# Patient Record
Sex: Female | Born: 1960
Health system: Southern US, Community
[De-identification: ages and names within clinical notes are randomized; demographics above are authoritative.]

## PROBLEM LIST (undated history)

## (undated) DIAGNOSIS — H4010X0 Unspecified open-angle glaucoma, stage unspecified: Secondary | ICD-10-CM

## (undated) DIAGNOSIS — E669 Obesity, unspecified: Secondary | ICD-10-CM

## (undated) DIAGNOSIS — N6310 Unspecified lump in the right breast, unspecified quadrant: Secondary | ICD-10-CM

## (undated) DIAGNOSIS — E785 Hyperlipidemia, unspecified: Secondary | ICD-10-CM

## (undated) DIAGNOSIS — J45909 Unspecified asthma, uncomplicated: Secondary | ICD-10-CM

## (undated) DIAGNOSIS — R7303 Prediabetes: Secondary | ICD-10-CM

## (undated) HISTORY — DX: Prediabetes: R73.03

## (undated) HISTORY — DX: Obesity, unspecified: E66.9

## (undated) HISTORY — DX: Unspecified lump in the right breast, unspecified quadrant: N63.10

## (undated) HISTORY — DX: Unspecified asthma, uncomplicated: J45.909

## (undated) HISTORY — DX: Unspecified open-angle glaucoma, stage unspecified: H40.10X0

## (undated) HISTORY — DX: Hyperlipidemia, unspecified: E78.5

---

## 1997-10-21 ENCOUNTER — Other Ambulatory Visit: Admission: RE | Admit: 1997-10-21 | Discharge: 1997-10-21 | Payer: Self-pay | Admitting: Obstetrics and Gynecology

## 1998-10-26 ENCOUNTER — Other Ambulatory Visit: Admission: RE | Admit: 1998-10-26 | Discharge: 1998-10-26 | Payer: Self-pay | Admitting: Obstetrics and Gynecology

## 1999-02-21 ENCOUNTER — Ambulatory Visit (HOSPITAL_COMMUNITY): Admission: RE | Admit: 1999-02-21 | Discharge: 1999-02-21 | Payer: Self-pay | Admitting: Family Medicine

## 1999-02-21 ENCOUNTER — Encounter: Payer: Self-pay | Admitting: Family Medicine

## 1999-04-14 ENCOUNTER — Encounter: Payer: Self-pay | Admitting: Obstetrics and Gynecology

## 1999-04-14 ENCOUNTER — Ambulatory Visit (HOSPITAL_COMMUNITY): Admission: RE | Admit: 1999-04-14 | Discharge: 1999-04-14 | Payer: Self-pay | Admitting: Obstetrics and Gynecology

## 2000-05-03 ENCOUNTER — Other Ambulatory Visit: Admission: RE | Admit: 2000-05-03 | Discharge: 2000-05-03 | Payer: Self-pay | Admitting: Obstetrics and Gynecology

## 2000-10-22 ENCOUNTER — Other Ambulatory Visit: Admission: RE | Admit: 2000-10-22 | Discharge: 2000-10-22 | Payer: Self-pay | Admitting: Obstetrics and Gynecology

## 2001-06-24 ENCOUNTER — Emergency Department (HOSPITAL_COMMUNITY): Admission: EM | Admit: 2001-06-24 | Discharge: 2001-06-24 | Payer: Self-pay

## 2001-06-24 ENCOUNTER — Encounter: Payer: Self-pay | Admitting: Emergency Medicine

## 2002-06-23 ENCOUNTER — Other Ambulatory Visit: Admission: RE | Admit: 2002-06-23 | Discharge: 2002-06-23 | Payer: Self-pay | Admitting: Obstetrics and Gynecology

## 2002-07-01 ENCOUNTER — Ambulatory Visit (HOSPITAL_COMMUNITY): Admission: RE | Admit: 2002-07-01 | Discharge: 2002-07-01 | Payer: Self-pay | Admitting: Obstetrics and Gynecology

## 2002-07-01 ENCOUNTER — Encounter: Payer: Self-pay | Admitting: Obstetrics and Gynecology

## 2003-11-02 ENCOUNTER — Other Ambulatory Visit: Admission: RE | Admit: 2003-11-02 | Discharge: 2003-11-02 | Payer: Self-pay | Admitting: Obstetrics and Gynecology

## 2003-12-06 ENCOUNTER — Emergency Department (HOSPITAL_COMMUNITY): Admission: EM | Admit: 2003-12-06 | Discharge: 2003-12-06 | Payer: Self-pay | Admitting: Family Medicine

## 2005-04-06 ENCOUNTER — Other Ambulatory Visit: Admission: RE | Admit: 2005-04-06 | Discharge: 2005-04-06 | Payer: Self-pay | Admitting: Obstetrics and Gynecology

## 2005-05-15 ENCOUNTER — Emergency Department (HOSPITAL_COMMUNITY): Admission: EM | Admit: 2005-05-15 | Discharge: 2005-05-15 | Payer: Self-pay | Admitting: Emergency Medicine

## 2005-08-02 ENCOUNTER — Encounter: Admission: RE | Admit: 2005-08-02 | Discharge: 2005-08-02 | Payer: Self-pay | Admitting: Obstetrics and Gynecology

## 2005-08-25 ENCOUNTER — Ambulatory Visit: Payer: Self-pay | Admitting: Internal Medicine

## 2006-12-17 ENCOUNTER — Emergency Department (HOSPITAL_COMMUNITY): Admission: EM | Admit: 2006-12-17 | Discharge: 2006-12-18 | Payer: Self-pay | Admitting: Emergency Medicine

## 2007-02-20 ENCOUNTER — Encounter: Admission: RE | Admit: 2007-02-20 | Discharge: 2007-02-20 | Payer: Self-pay | Admitting: Obstetrics and Gynecology

## 2007-02-27 ENCOUNTER — Encounter: Admission: RE | Admit: 2007-02-27 | Discharge: 2007-02-27 | Payer: Self-pay | Admitting: Obstetrics and Gynecology

## 2007-05-25 ENCOUNTER — Encounter: Payer: Self-pay | Admitting: *Deleted

## 2007-05-25 DIAGNOSIS — R519 Headache, unspecified: Secondary | ICD-10-CM | POA: Insufficient documentation

## 2007-05-25 DIAGNOSIS — I059 Rheumatic mitral valve disease, unspecified: Secondary | ICD-10-CM | POA: Insufficient documentation

## 2007-05-25 DIAGNOSIS — R51 Headache: Secondary | ICD-10-CM

## 2007-07-10 ENCOUNTER — Ambulatory Visit: Payer: Self-pay | Admitting: Internal Medicine

## 2007-07-12 ENCOUNTER — Ambulatory Visit: Payer: Self-pay | Admitting: Internal Medicine

## 2007-07-12 LAB — CONVERTED CEMR LAB
ALT: 12 units/L (ref 0–35)
Alkaline Phosphatase: 69 units/L (ref 39–117)
BUN: 6 mg/dL (ref 6–23)
Basophils Relative: 0.1 % (ref 0.0–1.0)
Bilirubin, Direct: 0.1 mg/dL (ref 0.0–0.3)
CO2: 26 meq/L (ref 19–32)
Calcium: 9 mg/dL (ref 8.4–10.5)
Creatinine, Ser: 0.7 mg/dL (ref 0.4–1.2)
Eosinophils Relative: 3.2 % (ref 0.0–5.0)
GFR calc Af Amer: 115 mL/min
Glucose, Bld: 104 mg/dL — ABNORMAL HIGH (ref 70–99)
HDL: 71.5 mg/dL (ref >39.0)
Hemoglobin: 13.3 g/dL (ref 12.0–15.0)
LDL Cholesterol: 98 mg/dL (ref 0–99)
Lymphocytes Relative: 24.1 % (ref 12.0–46.0)
Monocytes Absolute: 0.2 10*3/uL (ref 0.2–0.7)
Monocytes Relative: 3.3 % (ref 3.0–11.0)
Neutro Abs: 4.2 10*3/uL (ref 1.4–7.7)
Platelets: 356 10*3/uL (ref 150–400)
RDW: 14.8 % — ABNORMAL HIGH (ref 11.5–14.6)
Total Bilirubin: 0.5 mg/dL (ref 0.3–1.2)
Total Protein: 6.9 g/dL (ref 6.0–8.3)
VLDL: 10 mg/dL (ref 0–40)
WBC: 6 10*3/uL (ref 4.5–10.5)

## 2007-07-15 ENCOUNTER — Encounter: Payer: Self-pay | Admitting: Internal Medicine

## 2008-11-26 ENCOUNTER — Encounter: Admission: RE | Admit: 2008-11-26 | Discharge: 2008-11-26 | Payer: Self-pay | Admitting: Obstetrics and Gynecology

## 2008-11-30 ENCOUNTER — Ambulatory Visit: Payer: Self-pay | Admitting: Internal Medicine

## 2008-11-30 DIAGNOSIS — R5383 Other fatigue: Secondary | ICD-10-CM

## 2008-11-30 DIAGNOSIS — R7309 Other abnormal glucose: Secondary | ICD-10-CM

## 2008-11-30 DIAGNOSIS — R5381 Other malaise: Secondary | ICD-10-CM | POA: Insufficient documentation

## 2008-11-30 LAB — CONVERTED CEMR LAB
BUN: 10 mg/dL (ref 6–23)
Basophils Absolute: 0 10*3/uL (ref 0.0–0.1)
Basophils Relative: 0.8 % (ref 0.0–3.0)
Calcium: 9.8 mg/dL (ref 8.4–10.5)
Creatinine, Ser: 0.6 mg/dL (ref 0.4–1.2)
Eosinophils Absolute: 0.2 10*3/uL (ref 0.0–0.7)
GFR calc non Af Amer: 136.51 mL/min (ref >60)
Glucose, Bld: 96 mg/dL (ref 70–99)
Hgb A1c MFr Bld: 6.2 % (ref 4.6–6.5)
Lymphocytes Relative: 28.9 % (ref 12.0–46.0)
MCHC: 34.4 g/dL (ref 30.0–36.0)
MCV: 87.3 fL (ref 78.0–100.0)
Monocytes Absolute: 0.3 10*3/uL (ref 0.1–1.0)
Neutrophils Relative %: 60.5 % (ref 43.0–77.0)
Platelets: 261 10*3/uL (ref 150.0–400.0)
RDW: 12.4 % (ref 11.5–14.6)

## 2008-12-08 ENCOUNTER — Encounter: Payer: Self-pay | Admitting: Internal Medicine

## 2009-10-27 ENCOUNTER — Encounter: Payer: Self-pay | Admitting: Internal Medicine

## 2009-10-27 LAB — CONVERTED CEMR LAB: Pap Smear: NORMAL

## 2010-06-27 ENCOUNTER — Ambulatory Visit: Payer: Self-pay | Admitting: Internal Medicine

## 2010-08-24 ENCOUNTER — Encounter (INDEPENDENT_AMBULATORY_CARE_PROVIDER_SITE_OTHER): Payer: BC Managed Care – PPO | Admitting: Internal Medicine

## 2010-08-24 ENCOUNTER — Encounter: Payer: Self-pay | Admitting: Internal Medicine

## 2010-08-24 DIAGNOSIS — Z0000 Encounter for general adult medical examination without abnormal findings: Secondary | ICD-10-CM

## 2010-08-24 LAB — CONVERTED CEMR LAB
CO2: 27 meq/L (ref 19–32)
Calcium: 9.5 mg/dL (ref 8.4–10.5)
Cholesterol: 202 mg/dL — ABNORMAL HIGH (ref 0–200)
HDL: 62 mg/dL (ref >39)
MCV: 86.1 fL (ref 78.0–100.0)
Platelets: 304 10*3/uL (ref 150–400)
Potassium: 4.2 meq/L (ref 3.5–5.3)
Sodium: 141 meq/L (ref 135–145)
TSH: 0.581 microintl units/mL (ref 0.350–4.500)
Total CHOL/HDL Ratio: 3.3
VLDL: 11 mg/dL (ref 0–40)
WBC: 5.8 10*3/uL (ref 4.0–10.5)

## 2010-08-25 ENCOUNTER — Encounter (INDEPENDENT_AMBULATORY_CARE_PROVIDER_SITE_OTHER): Payer: Self-pay | Admitting: *Deleted

## 2010-08-25 ENCOUNTER — Encounter: Payer: Self-pay | Admitting: Internal Medicine

## 2010-08-31 NOTE — Assessment & Plan Note (Signed)
Summary: cpx/mhf   Vital Signs:  Patient profile:   50 year old female Height:      65 inches Weight:      170.31 pounds BMI:     28.44 O2 Sat:      100 % on Room air Temp:     98.4 degrees F oral Pulse rate:   88 / minute Resp:     18 per minute BP sitting:   114 / 60  (right arm) Cuff size:   regular  Vitals Entered By: Glendell Docker CMA (August 24, 2010 8:43 AM)  O2 Flow:  Room air CC: CPX Is Patient Diabetic? No Pain Assessment Patient in pain? no      Comments fasting for labs, request diabetes check, and colononscopy, no heath  changes since last office visit, EKG done with GYN in 10/2009   Primary Care Provider:  D. Thomos Lemons DO  CC:  CPX.  History of Present Illness: 50 y/o AA female for routine cpx no sign int hx.  reviewed fam hx and soc hx  exercises 3 x per week,  jumps rope, walking,  abd crunches takes multivitamin and b vitamin.  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)   Borerline DM II     Past Surgical History: WISDOM TEETH EXTRACTION, HX OF (ICD-V15.9)       Family History: Mother has hypertension and hyperlipidemia  - PAD (S/P right foot amputation)  Father deceased at age 30 of cancer (pancreatic cancer)  no colon ca no breast ca sister died 68 - Crohns Disease 1 sister, 2 brother - AW  Social History: Never Smoked Alcohol use-no Married  Occupation:  Training and development officer for [employer] no children    Caffeine use/day:   1 per week  Review of Systems  The patient denies fever, weight loss, weight gain, chest pain, dyspnea on exertion, abdominal pain, melena, hematochezia, severe indigestion/heartburn, and depression.    Physical Exam  General:  alert, well-developed, and well-nourished.   Head:  normocephalic and atraumatic.   Eyes:  pupils equal, pupils round, and pupils reactive to light.   Ears:  R ear normal and L ear normal.   Mouth:  good dentition.   Neck:  No deformities, masses, or tenderness noted. Lungs:  normal respiratory effort, normal breath sounds, no crackles, and no wheezes.   Heart:  normal rate, regular rhythm, and no gallop.   Abdomen:  soft, non-tender, normal bowel sounds, no masses, no hepatomegaly, and no splenomegaly.   Pulses:  dorsalis pedis and posterior tibial pulses are full and equal bilaterally Extremities:  No lower extremity edema  Neurologic:  cranial nerves II-XII intact and gait normal.   Psych:  normally interactive, good eye contact, not anxious appearing, and not depressed appearing.     Impression & Recommendations:  Problem # 1:  HEALTH MAINTENANCE EXAM (ICD-V70.0) Reviewed adult health maintenance protocols. Pt counseled on diet and exercise. refer for screening colonoscopy  Mammogram:  normal (08/24/2009) Pap smear: normal (10/27/2009) Bone Density: normal (10/27/2009) Td Booster: given (12/26/2004)   Flu Vax: Historical (06/21/2010)   Chol: 179 (07/12/2007)   HDL: 71.5 (07/12/2007)   LDL: 98 (07/12/2007)   TG: 49 (07/12/2007) TSH: 0.97 (11/30/2008)   HgbA1C: 6.2 (11/30/2008)     Orders: Gastroenterology Referral (GI)  Other Orders: T-Basic Metabolic Panel [phone]) T- Hemoglobin A1C [phone]) T-CBC No Diff (85027-10000) T-Lipid Profile (40347-42595) CRP, high sensitivity-[facility] (63875-64332) T-TSH (95188-41660)  Patient Instructions: 1)  Please schedule a follow-up appointment in 1 year. 2)  Our office will contact you re:  referral  for screening colonoscopy 3)  Please forward copy of your previous EKG    Orders Added: 1)  Gastroenterology Referral [GI] 2)  T-Basic Metabolic Panel [[id number]] 3)  T- Hemoglobin A1C [83036-23375] 4)  T-CBC No Diff [85027-10000] 5)  T-Lipid Profile [80061-22930] 6)  CRP, high sensitivity-[facility] [[id number]] 7)  T-TSH [23557-32202] 8)  Est. Patient 40-64 years [[id number]]   Immunization History:  Influenza Immunization History:    Influenza:  historical (06/21/2010)   Immunization History:  Influenza Immunization History:    Influenza:  Historical (06/21/2010)  Current Allergies (reviewed today): No known allergies    Preventive Care Screening  Bone Density:    Date:  10/27/2009    Results:  normal std dev  Pap Smear:    Date:  10/27/2009    Results:  normal   Mammogram:    Date:  08/24/2009    Results:  normal

## 2010-08-31 NOTE — Letter (Signed)
   [place] at [facility] [address] [place], [place]  [address]  [place] Phone: [phone]      August 25, 2010   Gloria [facility] [address] [place], [place] [address]  RE:  LAB RESULTS  Dear  Ms. Motz,  The following is an interpretation of your most recent lab tests.  Please take note of any instructions provided or changes to medications that have resulted from your lab work.  ELECTROLYTES:  Good - no changes needed  KIDNEY FUNCTION TESTS:  Good - no changes needed  LIPID PANEL:  Stable - no changes needed, Fair - review at your next visit Triglyceride: 56   Cholesterol: 202   LDL: 129   HDL: 62   Chol/HDL%:  3.3 Ratio  THYROID STUDIES:  Thyroid studies normal TSH: 0.581      CBC:  Good - no changes needed  HgA1c - 6.2 (abnormal)     Your blood test shows you are a borderline diabetic.   Please avoid sweets and try to limit your carbohydrate intake to 25 grams per meal.  Regular exercise will also help prevent progression to diabetes.   I suggest we repeat your A1c in 6 months.      Sincerely Yours,    Dr. Thomos Lemons  Appended Document:  mailed

## 2010-08-31 NOTE — Letter (Signed)
Summary: Primary Care Consult Scheduled Letter  [place] at [facility]  [address]   [place], [place] [address]   Phone: [phone]  Fax: [phone]      08/25/2010 MRN: [id number]  CHERISA BRUCKER [address] [place], [place]  [address]  [place]    Dear Ms. Nanda,      We have scheduled an appointment for you.  At the recommendation of Dr._YOO, we have scheduled you for colonoscopy at  [facility], DR Leone Payor on September 21, 2010  at 10AM ARRIVE @ 9AM .  Their address [address]  [place] The office phone number is [phone].  If this appointment day and time is not convenient for you, please feel free to call the office of the doctor you are being referred to at the number listed above and reschedule the appointment.     PREVISIT OF PREP AND INSTRUCTION --- Priscille Loveless [phone] @ 9AM    It is important for you to keep your scheduled appointments. We are here to make sure you are given good patient care.     Thank you, Darral Dash Patient Care Coordinator  at [facility]

## 2010-08-31 NOTE — Letter (Signed)
Summary: Pre Visit Letter Revised  [facility]  [address] [place], [place] [address]   Phone: [phone]  Fax: [phone]        08/25/2010 MRN: [id number]  Gloria Reid [address] [place], [place]  [address]  [place]             Procedure Date:  09-21-10 10am           Dr Leone Payor Direct Colon    Welcome to the Gastroenterology Division at [facility].    You are scheduled to see a nurse for your pre-procedure visit on 09-06-10 at 9am on the 3rd floor at [facility], [address]  We ask that you try to arrive at our office 15 minutes prior to your appointment time to allow for check-in.  Please take a minute to review the attached form.  If you answer "Yes" to one or more of the questions on the first page, we ask that you call the person listed at your earliest opportunity.  If you answer "No" to all of the questions, please complete the rest of the form and bring it to your appointment.    Your nurse visit will consist of discussing your medical and surgical history, your immediate family medical history, and your medications.   If you are unable to list all of your medications on the form, please bring the medication bottles to your appointment and we will list them.  We will need to be aware of both prescribed and over the counter drugs.  We will need to know exact dosage information as well.    Please be prepared to read and sign documents such as consent forms, a financial agreement, and acknowledgement forms.  If necessary, and with your consent, a friend or relative is welcome to sit-in on the nurse visit with you.  Please bring your insurance card so that we may make a copy of it.  If your insurance requires a referral to see a specialist, please bring your referral form from your primary care physician.  No co-pay is required for this nurse visit.     If you cannot keep your appointment, please call [phone] to cancel or reschedule prior  to your appointment date.  This allows [place] the opportunity to schedule an appointment for another patient in need of care.    Thank you for choosing [facility] for your medical needs.  We appreciate the opportunity to care for you.  Please visit [place] at our website  to learn more about our practice.  Sincerely, The Gastroenterology Division

## 2010-09-21 ENCOUNTER — Other Ambulatory Visit: Payer: BC Managed Care – PPO | Admitting: Internal Medicine

## 2011-04-19 ENCOUNTER — Ambulatory Visit
Admission: RE | Admit: 2011-04-19 | Discharge: 2011-04-19 | Disposition: A | Discharge status: Home / Self Care | Payer: BC Managed Care – PPO | Source: Ambulatory Visit | Attending: Obstetrics and Gynecology | Admitting: Obstetrics and Gynecology

## 2011-04-19 ENCOUNTER — Other Ambulatory Visit: Payer: Self-pay | Admitting: Obstetrics and Gynecology

## 2011-04-19 DIAGNOSIS — Z1231 Encounter for screening mammogram for malignant neoplasm of breast: Secondary | ICD-10-CM

## 2011-04-19 DIAGNOSIS — N63 Unspecified lump in unspecified breast: Secondary | ICD-10-CM

## 2011-05-03 ENCOUNTER — Other Ambulatory Visit: Payer: Self-pay | Admitting: Obstetrics and Gynecology

## 2011-05-03 ENCOUNTER — Ambulatory Visit
Admission: RE | Admit: 2011-05-03 | Discharge: 2011-05-03 | Disposition: A | Discharge status: Home / Self Care | Payer: BC Managed Care – PPO | Source: Ambulatory Visit | Attending: Obstetrics and Gynecology | Admitting: Obstetrics and Gynecology

## 2011-05-03 DIAGNOSIS — N63 Unspecified lump in unspecified breast: Secondary | ICD-10-CM

## 2011-05-17 ENCOUNTER — Other Ambulatory Visit (INDEPENDENT_AMBULATORY_CARE_PROVIDER_SITE_OTHER): Payer: Self-pay | Admitting: Surgery

## 2011-05-17 ENCOUNTER — Ambulatory Visit (INDEPENDENT_AMBULATORY_CARE_PROVIDER_SITE_OTHER): Payer: BC Managed Care – PPO | Admitting: Surgery

## 2011-05-17 ENCOUNTER — Encounter (INDEPENDENT_AMBULATORY_CARE_PROVIDER_SITE_OTHER): Payer: Self-pay | Admitting: Surgery

## 2011-05-17 VITALS — BP 122/84 | HR 82 | Temp 97.6°F | Resp 14 | Ht 65.0 in | Wt 173.6 lb

## 2011-05-17 DIAGNOSIS — N6310 Unspecified lump in the right breast, unspecified quadrant: Secondary | ICD-10-CM

## 2011-05-17 DIAGNOSIS — N63 Unspecified lump in unspecified breast: Secondary | ICD-10-CM

## 2011-05-17 NOTE — Progress Notes (Signed)
Chief Complaint   Patient presents with   .  Other     Eval of right breast - palpable mass   HPI  Gloria Reid is a 50 y.o. female. This healthy patient has had a palpable mass in her right upper outer breast near her axilla for at least 6 years. She feels that this has increased in size slightly. She has undergone thorough workup with a mammogram and ultrasound. This was not helpful in localizing any discrete mass. The to his persistence in its enlargement the patient now presents for surgical evaluation.  HPI  Past Medical History   Diagnosis  Date   .  Breast mass, right    History reviewed. No pertinent past surgical history.  History reviewed. No pertinent family history.  Paternal aunt - breast cancer in her 60's  Social History  History   Substance Use Topics   .  Smoking status:  Never Smoker   .  Smokeless tobacco:  Never Used   .  Alcohol Use:  No   No Known Allergies  No current outpatient prescriptions on file.   Review of Systems  Review of Systems  Menarche age 12  Pregnancy age 35 - miscarriage  Menopause - age 47  ROS otherwise negative  Blood pressure 122/84, pulse 82, temperature 97.6 F (36.4 C), temperature source Temporal, resp. rate 14, height 5' 5" (1.651 m), weight 173 lb 9.6 oz (78.744 kg).  Physical Exam  Physical Exam  WDWN in NAD  HEENT: EOMI, sclera anicteric  Neck: No masses, no thyromegaly  Lungs: CTA bilaterally; normal respiratory effort  Breasts: Symmetric; no nipple retraction or discharge; deep in the right axillary tail there is an oval-shaped mass measuring about 3 cm in greatest dimension on the chest wall. No lymphadenopathy  CV: Regular rate and rhythm; no murmurs  Abd: +bowel sounds, soft, non-tender, no masses  Ext: Well-perfused; no edema  Skin: Warm, dry; no sign of jaundice  Data Reviewed  Mammogram/ ultrasound  Assessment  Palpable mass - right breast in axillary tail  Plan  Right excisional breast biopsy. The surgical  procedure was described to the patient. I discussed the incision type and location.  The risks and benefits of the procedure were described to the patient and she wishes to proceed.  We discussed the risks bleeding, infection, damage to other structures, need for further procedures/surgeries. We discussed the risk of seroma. The patient was advised if the area in the breast in cancer, we may need to go back to surgery for additional tissue to obtain negative margins or for a lymph node biopsy. The patient was advised that these are the most common complications, but that others can occur as well. They were advised against taking aspirin or other anti-inflammatory agents/blood thinners the week before surgery. Likelihood if success in removing the breast abnormality is good.  Gloria Reid K.  05/17/2011, 1:28 PM  

## 2011-05-17 NOTE — H&P (Signed)
Chief Complaint   Patient presents with   .  Other     Eval of right breast - palpable mass   HPI  Gloria Reid is a 50 y.o. female. This healthy patient has had a palpable mass in her right upper outer breast near her axilla for at least 6 years. She feels that this has increased in size slightly. She has undergone thorough workup with a mammogram and ultrasound. This was not helpful in localizing any discrete mass. The to his persistence in its enlargement the patient now presents for surgical evaluation.  HPI  Past Medical History   Diagnosis  Date   .  Breast mass, right    History reviewed. No pertinent past surgical history.  History reviewed. No pertinent family history.  Paternal aunt - breast cancer in her 16's  Social History  History   Substance Use Topics   .  Smoking status:  Never Smoker   .  Smokeless tobacco:  Never Used   .  Alcohol Use:  No   No Known Allergies  No current outpatient prescriptions on file.   Review of Systems  Review of Systems  Menarche age 29  Pregnancy age 10 - miscarriage  Menopause - age 30  ROS otherwise negative  Blood pressure 122/84, pulse 82, temperature 97.6 F (36.4 C), temperature source Temporal, resp. rate 14, height 5\' 5"  (1.651 m), weight 173 lb 9.6 oz (78.744 kg).  Physical Exam  Physical Exam  WDWN in NAD  HEENT: EOMI, sclera anicteric  Neck: No masses, no thyromegaly  Lungs: CTA bilaterally; normal respiratory effort  Breasts: Symmetric; no nipple retraction or discharge; deep in the right axillary tail there is an oval-shaped mass measuring about 3 cm in greatest dimension on the chest wall. No lymphadenopathy  CV: Regular rate and rhythm; no murmurs  Abd: +bowel sounds, soft, non-tender, no masses  Ext: Well-perfused; no edema  Skin: Warm, dry; no sign of jaundice  Data Reviewed  Mammogram/ ultrasound  Assessment  Palpable mass - right breast in axillary tail  Plan  Right excisional breast biopsy. The surgical  procedure was described to the patient. I discussed the incision type and location.  The risks and benefits of the procedure were described to the patient and she wishes to proceed.  We discussed the risks bleeding, infection, damage to other structures, need for further procedures/surgeries. We discussed the risk of seroma. The patient was advised if the area in the breast in cancer, we may need to go back to surgery for additional tissue to obtain negative margins or for a lymph node biopsy. The patient was advised that these are the most common complications, but that others can occur as well. They were advised against taking aspirin or other anti-inflammatory agents/blood thinners the week before surgery. Likelihood if success in removing the breast abnormality is good.  Gloria Reid K.  05/17/2011, 1:28 PM

## 2011-06-16 ENCOUNTER — Encounter (HOSPITAL_COMMUNITY): Admission: RE | Payer: Self-pay | Source: Ambulatory Visit

## 2011-06-16 ENCOUNTER — Ambulatory Visit (HOSPITAL_COMMUNITY): Admission: RE | Admit: 2011-06-16 | Payer: BC Managed Care – PPO | Source: Ambulatory Visit | Admitting: Surgery

## 2011-06-16 SURGERY — EXCISION MASS
Anesthesia: General | Site: Breast | Laterality: Right

## 2011-08-25 ENCOUNTER — Other Ambulatory Visit (INDEPENDENT_AMBULATORY_CARE_PROVIDER_SITE_OTHER): Payer: BC Managed Care – PPO

## 2011-08-25 DIAGNOSIS — Z0000 Encounter for general adult medical examination without abnormal findings: Secondary | ICD-10-CM

## 2011-08-25 LAB — CBC WITH DIFFERENTIAL/PLATELET
Basophils Absolute: 0 10*3/uL (ref 0.0–0.1)
Eosinophils Absolute: 0.1 10*3/uL (ref 0.0–0.7)
HCT: 41.1 % (ref 36.0–46.0)
Lymphs Abs: 1.5 10*3/uL (ref 0.7–4.0)
MCV: 86.1 fl (ref 78.0–100.0)
Monocytes Absolute: 0.3 10*3/uL (ref 0.1–1.0)
Platelets: 267 10*3/uL (ref 150.0–400.0)
RDW: 13.8 % (ref 11.5–14.6)

## 2011-08-25 LAB — POCT URINALYSIS DIPSTICK
Ketones, UA: NEGATIVE
Protein, UA: NEGATIVE
Spec Grav, UA: <=1.005
pH, UA: 5.5

## 2011-08-25 LAB — TSH: TSH: 0.84 u[IU]/mL (ref 0.35–5.50)

## 2011-08-25 LAB — LIPID PANEL
Cholesterol: 193 mg/dL (ref 0–200)
HDL: 67.2 mg/dL (ref >39.00)
VLDL: 9.2 mg/dL (ref 0.0–40.0)

## 2011-08-25 LAB — HEPATIC FUNCTION PANEL: Total Bilirubin: 0.2 mg/dL — ABNORMAL LOW (ref 0.3–1.2)

## 2011-08-25 LAB — BASIC METABOLIC PANEL
BUN: 11 mg/dL (ref 6–23)
Chloride: 108 mEq/L (ref 96–112)
Glucose, Bld: 101 mg/dL — ABNORMAL HIGH (ref 70–99)
Potassium: 3.7 mEq/L (ref 3.5–5.1)

## 2011-09-01 ENCOUNTER — Encounter: Payer: Self-pay | Admitting: Internal Medicine

## 2011-09-01 ENCOUNTER — Ambulatory Visit (INDEPENDENT_AMBULATORY_CARE_PROVIDER_SITE_OTHER): Payer: BC Managed Care – PPO | Admitting: Internal Medicine

## 2011-09-01 VITALS — BP 108/78 | HR 84 | Temp 98.6°F | Ht 65.0 in | Wt 180.0 lb

## 2011-09-01 DIAGNOSIS — Z0000 Encounter for general adult medical examination without abnormal findings: Secondary | ICD-10-CM

## 2011-09-01 DIAGNOSIS — N63 Unspecified lump in unspecified breast: Secondary | ICD-10-CM

## 2011-09-01 DIAGNOSIS — N6310 Unspecified lump in the right breast, unspecified quadrant: Secondary | ICD-10-CM

## 2011-09-01 NOTE — Progress Notes (Signed)
  Subjective:    Patient ID: Gloria Reid, female    DOB: Nov 12, 1960, 51 y.o.   MRN: [id number]  HPI  51 year old African American female for routine physical. Since previous visit patient has had mammogram with possible enlarging right breath mass.  Mammogram and breast u/s was not suggestive of malignancy.  However, pt was advised to have surgical follow up.   Pt thought she was having office procedure of needle biopsy but same day surgery was scheduled which she declined.  Otherwise, pt has been following healthier diet.  She avoid sugary beverages.  Her weight is stable.  Screening lab results reviewed in detail.  Mammogram: 04/19/11 No mammographic or sonographic evidence of malignancy is identified  in either breast. I do palpate a firm mass-like area in the  axillary tail the right breast, which the patient has palpated for  several years. She believes that has increased in size, and its  presence is somewhat bothersome to her.   Review of Systems   Constitutional: Negative for activity change, appetite change and unexpected weight change.  Eyes: Negative for visual disturbance.  Respiratory: Negative for cough, chest tightness and shortness of breath.   Cardiovascular: Negative for chest pain.  Genitourinary: Negative for difficulty urinating.  Neurological: Negative for headaches.  Gastrointestinal: Negative for abdominal pain, heartburn melena or hematochezia Psych: Negative for depression or anxiety  Past Medical History  Diagnosis Date  . Breast mass, right     History   Social History  . Marital Status: Married    Spouse Name: N/A    Number of Children: N/A  . Years of Education: N/A   Occupational History  . Not on file.   Social History Main Topics  . Smoking status: Never Smoker   . Smokeless tobacco: Never Used  . Alcohol Use: No  . Drug Use: No  . Sexually Active: Not on file   Other Topics Concern  . Not on file   Social History Narrative   . No narrative on file    No past surgical history on file.  No family history on file.  No Known Allergies  No current outpatient prescriptions on file prior to visit.    BP 108/78  Pulse 84  Temp(Src) 98.6 F (37 C) (Oral)  Ht 5\' 5"  (1.651 m)  Wt 180 lb (81.647 kg)  BMI 29.95 kg/m2  SpO2 98%     Objective:   Physical Exam  Constitutional: She is oriented to person, place, and time. She appears well-developed and well-nourished.  HENT:  Head: Normocephalic and atraumatic.  Right Ear: External ear normal.  Left Ear: External ear normal.  Mouth/Throat: Oropharynx is clear and moist.  Eyes: Conjunctivae are normal. Pupils are equal, round, and reactive to light.  Neck: Neck supple.  Cardiovascular: Normal rate, regular rhythm and normal heart sounds.   Pulmonary/Chest: Effort normal and breath sounds normal. She has no wheezes. She has no rales.       2 cm right breat mass - 9 to 10 o'clock position.  Firm to deep palpation.  Non tender  Abdominal: Soft. Bowel sounds are normal.  Musculoskeletal: She exhibits no edema.  Lymphadenopathy:    She has no cervical adenopathy.  Neurological: She is alert and oriented to person, place, and time.  Skin: Skin is warm and dry.      Assessment & Plan:

## 2011-09-01 NOTE — Patient Instructions (Addendum)
Please follow with surgeon re:  Right breast mass Please complete the following lab tests before your next follow up appointment: CPX labs - 790.29

## 2011-09-01 NOTE — Assessment & Plan Note (Signed)
Reviewed adult health maintenance protocols.  I encouraged further weight loss.  She has shown signs of pre diabetes in the past.   She has done well with dietary changes.  Arrange iFOB.

## 2011-09-01 NOTE — Assessment & Plan Note (Signed)
Consult Dr. Corliss Skains re:  Right breast mass.  Pt would prefer needle biopsy but understands she may require lumpectomy for definitive diagnosis.

## 2011-09-04 ENCOUNTER — Encounter (INDEPENDENT_AMBULATORY_CARE_PROVIDER_SITE_OTHER): Payer: Self-pay | Admitting: Surgery

## 2011-09-14 ENCOUNTER — Encounter: Payer: Self-pay | Admitting: Speech Pathology

## 2011-09-18 ENCOUNTER — Encounter (INDEPENDENT_AMBULATORY_CARE_PROVIDER_SITE_OTHER): Payer: Self-pay | Admitting: Surgery

## 2011-09-19 ENCOUNTER — Encounter (INDEPENDENT_AMBULATORY_CARE_PROVIDER_SITE_OTHER): Payer: BC Managed Care – PPO | Admitting: Surgery

## 2011-10-03 ENCOUNTER — Encounter (INDEPENDENT_AMBULATORY_CARE_PROVIDER_SITE_OTHER): Payer: BC Managed Care – PPO | Admitting: Surgery

## 2011-10-05 ENCOUNTER — Encounter (INDEPENDENT_AMBULATORY_CARE_PROVIDER_SITE_OTHER): Payer: BC Managed Care – PPO | Admitting: Surgery

## 2011-10-20 ENCOUNTER — Ambulatory Visit (INDEPENDENT_AMBULATORY_CARE_PROVIDER_SITE_OTHER): Payer: BC Managed Care – PPO | Admitting: Surgery

## 2011-10-20 ENCOUNTER — Encounter (INDEPENDENT_AMBULATORY_CARE_PROVIDER_SITE_OTHER): Payer: Self-pay | Admitting: Surgery

## 2011-10-20 VITALS — BP 122/76 | HR 72 | Temp 97.8°F | Resp 16 | Ht 65.0 in | Wt 174.0 lb

## 2011-10-20 DIAGNOSIS — N63 Unspecified lump in unspecified breast: Secondary | ICD-10-CM

## 2011-10-20 DIAGNOSIS — N6310 Unspecified lump in the right breast, unspecified quadrant: Secondary | ICD-10-CM

## 2011-10-20 NOTE — Progress Notes (Signed)
Today's note 10/20/11  The patient returns again to discuss the palpable right breast mass.  She feels that this might be slightly larger.  She intentionally chose not to schedule surgery as recommended last year because she doesn't want to have any anesthesia.    She was evaluated in 2007 by Dr. Francina Ames for this same complaint, and mammogram/ U/S were not helpful at that time either.  Last years' studies were also unremarkable, although the mass remains palpable.  No discrete masses are noted on imaging that would allow for an image-directed core needle biopsy.  Therefore, we recommended surgical excision.    PMH/ PSH unchanged.  The patient states that she has never had surgery or any type of anesthesia.  Filed Vitals:   10/20/11 0938  BP: 122/76  Pulse: 72  Temp: 97.8 F (36.6 C)  Resp: 16    Breast examination:    Symmetric, no nipple retraction or nipple discharge No axillary lymphadenopathy Persistent area of firmness deep in the right breast near the axillary tail at 10:00.    Imp:  Persistent, possibly mildly enlarged right breast mass - has been present for at least 6 years.  Recommendations:  Again, I recommended excision right breast biopsy.  I spent some time with the patient discussing her aversion to anesthesia.  She states that she doesn't want to have any drugs given in an IV.  She denies any previous bad experiences with anesthesia.  The mass is too deep to remove under straight local.  She refuses even to consider deep MAC with IV sedation and local.  She actually seems angry when I tried to convince her that anesthesia is relatively safe, and that the only way to know for sure what this mass represents is to remove for pathology.  I explained the prevalence of breast cancer in the female population and that breast cancer is usually easier to treat if caught early.   She refuses to even consider surgical biopsy, which begs the question of why she returned today.  I gave  her the same recommendation that I gave last year.  Her anger and reluctance to even consider surgical biopsy seem rather irrational.  I recommended that she find another surgeon for a second opinion and treatment.  They will likely give her the same recommendation.    Wilmon Arms. Corliss Skains, MD, [facility]  10/20/2011 12:17 PM     Previous note from 05/17/11.    HPI Odette Watanabe is a 51 y.o. female. This healthy patient has had a palpable mass in her right upper outer breast near her axilla for at least 6 years. She feels that this has increased in size slightly. She has undergone thorough workup with a mammogram and ultrasound. This was not helpful in localizing any discrete mass. The to his persistence in its enlargement the patient now presents for surgical evaluation. HPI  Past Medical History  Diagnosis Date  . Breast mass, right     History reviewed. No pertinent past surgical history.  History reviewed. No pertinent family history. Paternal aunt - breast cancer in her 99's Social History History  Substance Use Topics  . Smoking status: Never Smoker   . Smokeless tobacco: Never Used  . Alcohol Use: No    No Known Allergies  No current outpatient prescriptions on file.    Review of Systems Review of Systems Menarche age 78 Pregnancy age 70 - miscarriage Menopause - age 57  ROS otherwise negative Blood pressure  122/84, pulse 82, temperature 97.6 F (36.4 C), temperature source Temporal, resp. rate 14, height 5\' 5"  (1.651 m), weight 173 lb 9.6 oz (78.744 kg).  Physical Exam Physical Exam WDWN in NAD HEENT:  EOMI, sclera anicteric Neck:  No masses, no thyromegaly Lungs:  CTA bilaterally; normal respiratory effort Breasts:  Symmetric; no nipple retraction or discharge; deep in the right axillary tail there is an oval-shaped mass measuring about 3 cm in greatest dimension on the chest wall.  No lymphadenopathy CV:  Regular rate and rhythm; no  murmurs Abd:  +bowel sounds, soft, non-tender, no masses Ext:  Well-perfused; no edema Skin:  Warm, dry; no sign of jaundice  Data Reviewed Mammogram/ ultrasound  Assessment    Palpable mass - right breast in axillary tail    Plan    Right excisional breast biopsy.  The surgical procedure was described to the patient.  I discussed the incision type and location.      The risks and benefits of the procedure were described to the patient and she wishes to proceed.    We discussed the risks bleeding, infection, damage to other structures, need for further procedures/surgeries.  We discussed the risk of seroma.  The patient was advised if the area in the breast in cancer, we may need to go back to surgery for additional tissue to obtain negative margins or for a lymph node biopsy. The patient was advised that these are the most common complications, but that others can occur as well.  They were advised against taking aspirin or other anti-inflammatory agents/blood thinners the week before surgery.  Likelihood if success in removing the breast abnormality is good.        Kenetra Hildenbrand K. 05/17/2011, 1:28 PM

## 2011-10-27 ENCOUNTER — Encounter: Payer: Self-pay | Admitting: *Deleted

## 2011-10-27 ENCOUNTER — Encounter: Payer: BC Managed Care – PPO | Attending: Obstetrics and Gynecology | Admitting: *Deleted

## 2011-10-27 VITALS — Ht 65.0 in | Wt 174.9 lb

## 2011-10-27 DIAGNOSIS — R7309 Other abnormal glucose: Secondary | ICD-10-CM

## 2011-10-27 DIAGNOSIS — Z713 Dietary counseling and surveillance: Secondary | ICD-10-CM | POA: Insufficient documentation

## 2011-10-27 NOTE — Patient Instructions (Addendum)
Plan: Aim for 2-3 Carb Choices per meal (30-45 grams), 0-2 Carbs per snack if hungry Read Food Labels for Total Carbohydrate of foods eaten Consider checking BG about once a month at MD office to monitor BG before next MD appointment scheduled for 1 year from now.

## 2011-10-27 NOTE — Progress Notes (Signed)
Medical Nutrition Therapy:  Appt start time: 0800 end time:  0900.  Assessment:  Primary concerns today: patient here for nutrition counseling for abnormal glucose. 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 She has started including 3 meals a day, not skipping breakfast anymore. She has also cut out sweetened beverages recently.  MEDICATIONS: see list   DIETARY INTAKE:  Usual eating pattern includes 3 meals and 3 snacks per day.  Everyday foods include fair variety of all food groups.  Avoided foods include fried or high sugar foods now.    24-hr recall:  B ( AM): now: 8 oz milk first  Snk ( AM): fresh fruit x 2 + L ( PM): sandwich OR salad OR full meal, water  Snk ( PM): fresh fruit x 2 + D ( PM): at work, bring from home: meat, starch, vegetable,  Snk ( PM): fresh fruit x 1, then peanuts Beverages: water, flavored water with lemon, cuke, milk, occasionally tea  Usual physical activity: every AM stretches for 3-5 minutes, housework,   Estimated energy needs: 1500 calories 170 g carbohydrates 112 g protein 42 g fat  Progress Towards Goal(s):  In progress.   Nutritional Diagnosis:  NB-1.1 Food and nutrition-related knowledge deficit As related to elevated glucose levels.  As evidenced by FBG of 115 mg/dl.    Intervention:  Nutrition counseling provided including macro-nutrients and how to distribute especially carbohydrate intake for more consistent blood glucose levels. Also discussed basic physiology of diabetes for future reference, value of self-monitoring BG occasionally to assess progress, and reading food labels. Plan: Aim for 2-3 Carb Choices per meal (30-45 grams), 0-2 Carbs per snack if hungry Read Food Labels for Total Carbohydrate of foods eaten Consider checking BG about once a month at MD office to monitor BG before next MD appointment scheduled for 1 year from now.   Handouts given during visit include:  Living Well with  Diabetes  Carb Counting and Food Label handouts  Meal Plan Card  Monitoring/Evaluation:  Dietary intake, exercise, reading food labels, and body weight in 4 week(s).

## 2011-12-01 ENCOUNTER — Ambulatory Visit: Payer: BC Managed Care – PPO | Admitting: *Deleted

## 2011-12-19 ENCOUNTER — Ambulatory Visit: Payer: BC Managed Care – PPO | Admitting: *Deleted

## 2012-02-28 ENCOUNTER — Ambulatory Visit (INDEPENDENT_AMBULATORY_CARE_PROVIDER_SITE_OTHER): Payer: BC Managed Care – PPO | Admitting: Surgery

## 2012-02-28 ENCOUNTER — Encounter (INDEPENDENT_AMBULATORY_CARE_PROVIDER_SITE_OTHER): Payer: Self-pay | Admitting: Surgery

## 2012-02-28 VITALS — BP 132/88 | HR 96 | Temp 97.8°F | Ht 65.0 in | Wt 174.4 lb

## 2012-02-28 DIAGNOSIS — N6310 Unspecified lump in the right breast, unspecified quadrant: Secondary | ICD-10-CM

## 2012-02-28 DIAGNOSIS — N63 Unspecified lump in unspecified breast: Secondary | ICD-10-CM

## 2012-02-28 NOTE — Progress Notes (Signed)
Subjective:     Patient ID: Gloria Reid, female   DOB: 09-19-1960, 51 y.o.   MRN: [id number]  HPI  She is here for another opinion regarding her right breast mass which has been present for more than 5 years. She now is in agreement to have it removed. Again, her x-ray data is negative Review of Systems     Objective:   Physical Exam There is a 2-3 cm oval mass deep in the right breast and axilla    Assessment:    Right breast mass    Plan:     She is now in agreement to proceed with excisional biopsy which will be scheduled

## 2012-03-28 ENCOUNTER — Encounter (HOSPITAL_COMMUNITY): Payer: Self-pay | Admitting: Pharmacy Technician

## 2012-04-02 ENCOUNTER — Encounter (HOSPITAL_COMMUNITY)
Admission: RE | Admit: 2012-04-02 | Discharge: 2012-04-02 | Disposition: A | Discharge status: Home / Self Care | Payer: BC Managed Care – PPO | Source: Ambulatory Visit | Attending: Surgery | Admitting: Surgery

## 2012-04-02 ENCOUNTER — Encounter (HOSPITAL_COMMUNITY): Payer: Self-pay

## 2012-04-02 LAB — CBC
MCH: 28.8 pg (ref 26.0–34.0)
MCV: 84.3 fL (ref 78.0–100.0)
Platelets: 258 10*3/uL (ref 150–400)
RDW: 13.3 % (ref 11.5–15.5)
WBC: 5.6 10*3/uL (ref 4.0–10.5)

## 2012-04-02 LAB — SURGICAL PCR SCREEN: MRSA, PCR: NEGATIVE

## 2012-04-02 NOTE — Patient Instructions (Addendum)
20 Gloria Reid  04/02/2012   Your procedure is scheduled on:  04/03/12  Wednesday  Surgery 1130-1230  Report to [facility] at  0900     AM.  Call this number if you have problems the morning of surgery: [phone]     Or PST   [id number]  [facility]   Remember:   Do not eat food  Or drink any fluids :After Midnight tonight      Take these medicines the morning of surgery with A SIP OF WATER:  NONE   Do not wear jewelry, make-up or nail polish.  Do not wear lotions, powders, or perfumes. You may wear deodorant.  Do not shave 48 hours prior to surgery.  Do not bring valuables to the hospital.  Contacts, dentures or bridgework may not be worn into surgery.  Leave suitcase in the car. After surgery it may be brought to your room.  For patients admitted to the hospital, checkout time is 11:00 AM the day of discharge.   Patients discharged the day of surgery will not be allowed to drive home.  Name and phone number of your driver:   Husband [name]                                                                   Special Instructions: CHG Shower Use Special Wash:   SEE ADDITIONAL SHEET FOR SHOWER INSTRUCTIONS---  REGULAR SOAP FACE AND PRIVATES              LADIES- NO SHAVING 48 HOURS BEFORE USING BETASEPT SOAP.                   Please read over the following fact sheets that you were given: MRSA Information

## 2012-04-03 ENCOUNTER — Encounter (HOSPITAL_COMMUNITY): Payer: Self-pay | Admitting: Anesthesiology

## 2012-04-03 ENCOUNTER — Ambulatory Visit (HOSPITAL_COMMUNITY): Payer: BC Managed Care – PPO | Admitting: Anesthesiology

## 2012-04-03 ENCOUNTER — Encounter (HOSPITAL_COMMUNITY)
Admission: RE | Disposition: A | Discharge status: Home / Self Care | Payer: Self-pay | Source: Ambulatory Visit | Attending: Surgery

## 2012-04-03 ENCOUNTER — Encounter (HOSPITAL_COMMUNITY): Payer: Self-pay | Admitting: *Deleted

## 2012-04-03 ENCOUNTER — Ambulatory Visit (HOSPITAL_COMMUNITY)
Admission: RE | Admit: 2012-04-03 | Discharge: 2012-04-03 | Disposition: A | Discharge status: Home / Self Care | Payer: BC Managed Care – PPO | Source: Ambulatory Visit | Attending: Surgery | Admitting: Surgery

## 2012-04-03 DIAGNOSIS — D249 Benign neoplasm of unspecified breast: Secondary | ICD-10-CM

## 2012-04-03 DIAGNOSIS — Z01812 Encounter for preprocedural laboratory examination: Secondary | ICD-10-CM | POA: Insufficient documentation

## 2012-04-03 DIAGNOSIS — N63 Unspecified lump in unspecified breast: Secondary | ICD-10-CM | POA: Insufficient documentation

## 2012-04-03 HISTORY — PX: MASS EXCISION: SHX2000

## 2012-04-03 SURGERY — EXCISION MASS
Anesthesia: General | Site: Breast | Laterality: Right | Wound class: Clean

## 2012-04-03 MED ORDER — DEXAMETHASONE SODIUM PHOSPHATE 4 MG/ML IJ SOLN
INTRAMUSCULAR | Status: DC | PRN
  Administered 2012-04-03: 10 mg via INTRAVENOUS

## 2012-04-03 MED ORDER — MIDAZOLAM HCL 5 MG/5ML IJ SOLN
INTRAMUSCULAR | Status: DC | PRN
  Administered 2012-04-03: 2 mg via INTRAVENOUS

## 2012-04-03 MED ORDER — FENTANYL CITRATE 0.05 MG/ML IJ SOLN
INTRAMUSCULAR | Status: DC | PRN
  Administered 2012-04-03: 50 ug via INTRAVENOUS

## 2012-04-03 MED ORDER — LIDOCAINE HCL (CARDIAC) 20 MG/ML IV SOLN
INTRAVENOUS | Status: DC | PRN
  Administered 2012-04-03: 100 mg via INTRAVENOUS

## 2012-04-03 MED ORDER — HYDROCODONE-ACETAMINOPHEN 5-325 MG PO TABS: 1.0000 | ORAL_TABLET | ORAL | Status: DC | PRN

## 2012-04-03 MED ORDER — CEFAZOLIN SODIUM-DEXTROSE 2-3 GM-% IV SOLR
INTRAVENOUS | Status: AC
  Filled 2012-04-03: qty 50

## 2012-04-03 MED ORDER — ONDANSETRON HCL 4 MG/2ML IJ SOLN
INTRAMUSCULAR | Status: DC | PRN
  Administered 2012-04-03: 4 mg via INTRAVENOUS

## 2012-04-03 MED ORDER — BUPIVACAINE LIPOSOME 1.3 % IJ SUSP
20.0000 mL | INTRAMUSCULAR | Status: DC
  Filled 2012-04-03: qty 20

## 2012-04-03 MED ORDER — ACETAMINOPHEN 10 MG/ML IV SOLN
INTRAVENOUS | Status: DC | PRN
  Administered 2012-04-03: 1000 mg via INTRAVENOUS

## 2012-04-03 MED ORDER — ACETAMINOPHEN 10 MG/ML IV SOLN
INTRAVENOUS | Status: AC
  Filled 2012-04-03: qty 100

## 2012-04-03 MED ORDER — BUPIVACAINE-EPINEPHRINE 0.25% -1:200000 IJ SOLN
INTRAMUSCULAR | Status: DC | PRN
Start: 2012-04-03 — End: 2012-04-03
  Administered 2012-04-03: 20 mL

## 2012-04-03 MED ORDER — PROPOFOL 10 MG/ML IV BOLUS
INTRAVENOUS | Status: DC | PRN
  Administered 2012-04-03: 200 mg via INTRAVENOUS

## 2012-04-03 MED ORDER — KETOROLAC TROMETHAMINE 30 MG/ML IJ SOLN
INTRAMUSCULAR | Status: DC | PRN
  Administered 2012-04-03: 30 mg via INTRAVENOUS

## 2012-04-03 MED ORDER — BUPIVACAINE-EPINEPHRINE PF 0.25-1:200000 % IJ SOLN
INTRAMUSCULAR | Status: AC
  Filled 2012-04-03: qty 30

## 2012-04-03 MED ORDER — FENTANYL CITRATE 0.05 MG/ML IJ SOLN: 25.0000 ug | INTRAMUSCULAR | Status: DC | PRN

## 2012-04-03 MED ORDER — LACTATED RINGERS IV SOLN
INTRAVENOUS | Status: DC
  Administered 2012-04-03: 12:00:00 via INTRAVENOUS
  Administered 2012-04-03: 1000 mL via INTRAVENOUS

## 2012-04-03 MED ORDER — CEFAZOLIN SODIUM-DEXTROSE 2-3 GM-% IV SOLR
2.0000 g | INTRAVENOUS | Status: AC
  Administered 2012-04-03: 2 g via INTRAVENOUS

## 2012-04-03 MED ORDER — PROMETHAZINE HCL 25 MG/ML IJ SOLN: 6.2500 mg | INTRAMUSCULAR | Status: DC | PRN

## 2012-04-03 SURGICAL SUPPLY — 40 items
BENZOIN TINCTURE PRP APPL 2/3 (GAUZE/BANDAGES/DRESSINGS) ×2 IMPLANT
BLADE HEX COATED 2.75 (ELECTRODE) ×2 IMPLANT
BLADE SURG 15 STRL LF DISP TIS (BLADE) ×1 IMPLANT
BLADE SURG 15 STRL SS (BLADE) ×1
BLADE SURG SZ10 CARB STEEL (BLADE) ×2 IMPLANT
CANISTER SUCTION 2500CC (MISCELLANEOUS) ×2 IMPLANT
CHLORAPREP W/TINT 26ML (MISCELLANEOUS) ×2 IMPLANT
CLOTH BEACON ORANGE TIMEOUT ST (SAFETY) ×2 IMPLANT
CLSR STERI-STRIP ANTIMIC 1/2X4 (GAUZE/BANDAGES/DRESSINGS) ×2 IMPLANT
DECANTER SPIKE VIAL GLASS SM (MISCELLANEOUS) IMPLANT
DERMABOND ADVANCED (GAUZE/BANDAGES/DRESSINGS)
DERMABOND ADVANCED .7 DNX12 (GAUZE/BANDAGES/DRESSINGS) IMPLANT
DRAIN PENROSE 18X1/2 LTX STRL (DRAIN) IMPLANT
DRAPE LAPAROTOMY TRNSV 102X78 (DRAPE) ×2 IMPLANT
ELECT REM PT RETURN 9FT ADLT (ELECTROSURGICAL) ×2
ELECTRODE REM PT RTRN 9FT ADLT (ELECTROSURGICAL) ×1 IMPLANT
GAUZE SPONGE 4X4 16PLY XRAY LF (GAUZE/BANDAGES/DRESSINGS) IMPLANT
GLOVE SURG SIGNA 7.5 PF LTX (GLOVE) ×8 IMPLANT
GOWN STRL NON-REIN LRG LVL3 (GOWN DISPOSABLE) ×2 IMPLANT
GOWN STRL REIN XL XLG (GOWN DISPOSABLE) ×2 IMPLANT
KIT BASIN OR (CUSTOM PROCEDURE TRAY) ×2 IMPLANT
NEEDLE HYPO 22GX1.5 SAFETY (NEEDLE) IMPLANT
NEEDLE HYPO 25X1 1.5 SAFETY (NEEDLE) ×2 IMPLANT
NS IRRIG 1000ML POUR BTL (IV SOLUTION) ×2 IMPLANT
PACK BASIC VI WITH GOWN DISP (CUSTOM PROCEDURE TRAY) ×2 IMPLANT
PENCIL BUTTON HOLSTER BLD 10FT (ELECTRODE) ×2 IMPLANT
SPONGE GAUZE 4X4 12PLY (GAUZE/BANDAGES/DRESSINGS) IMPLANT
SPONGE LAP 4X18 X RAY DECT (DISPOSABLE) ×2 IMPLANT
STRIP CLOSURE SKIN 1/2X4 (GAUZE/BANDAGES/DRESSINGS) ×2 IMPLANT
SUT MNCRL AB 4-0 PS2 18 (SUTURE) ×2 IMPLANT
SUT VIC AB 2-0 CT1 27 (SUTURE)
SUT VIC AB 2-0 CT1 TAPERPNT 27 (SUTURE) IMPLANT
SUT VIC AB 3-0 54XBRD REEL (SUTURE) IMPLANT
SUT VIC AB 3-0 BRD 54 (SUTURE)
SUT VIC AB 3-0 SH 27 (SUTURE) ×1
SUT VIC AB 3-0 SH 27XBRD (SUTURE) ×1 IMPLANT
SYR BULB IRRIGATION 50ML (SYRINGE) IMPLANT
SYR CONTROL 10ML LL (SYRINGE) ×2 IMPLANT
TOWEL OR 17X26 10 PK STRL BLUE (TOWEL DISPOSABLE) ×2 IMPLANT
YANKAUER SUCT BULB TIP 10FT TU (MISCELLANEOUS) ×2 IMPLANT

## 2012-04-03 NOTE — Preoperative (Signed)
Beta Blockers   Reason not to administer Beta Blockers:Not Applicable 

## 2012-04-03 NOTE — Op Note (Signed)
EXCISION MASS  Procedure Note  Gloria Reid 04/03/2012   Pre-op Diagnosis: Right breast mass     Post-op Diagnosis: same  Procedure(s): EXCISION MASS RIGHT BREAST  Surgeon(s): Shelly Rubenstein, MD  Anesthesia: General  Staff:  Dominga Ferry, RN - Circulator Matilde Sprang Player, CST - Scrub Person  Estimated Blood Loss: Minimal               Specimens: suspect lipoma  Procedure: The patient was brought to the operating room and identified as the correct patient. She was placed supine on the operating room table and general anesthesia was induced. Her right breast was then prepped and draped in the usual sterile fashion. The mass was located in the upper outer quadrant of the right breast. I anesthetized the skin with Marcaine and made an incision with a scalpel. I took this down to the breast tissue. The mass itself appeared consistent with a lipoma it was excised with the electrocautery. Once the mass was removed it was sent to pathology for evaluation. I thoroughly palpated the breast all around the biopsy site and could feel no other masses or abnormalities. The incision was anesthetized further. Hemostasis was achieved with cautery. Because of subspace tissue with interrupted 3-0 Vicryl sutures and closed the skin with a running 4-0 Monocryl. Steri-Strips, gauze, and Tegaderm were then applied. The patient tolerated procedure well. All the counterproductive at the end of the procedure. The patient was then extubated in the operating room and taken in stable condition to the recovery room.          Samyak Sackmann A   Date: 04/03/2012  Time: 11:59 AM

## 2012-04-03 NOTE — Anesthesia Postprocedure Evaluation (Signed)
  Anesthesia Post-op Note  Patient: Gloria Reid  Procedure(s) Performed: Procedure(s) (LRB): EXCISION MASS (Right)  Patient Location: PACU  Anesthesia Type: General  Level of Consciousness: awake and alert   Airway and Oxygen Therapy: Patient Spontanous Breathing  Post-op Pain: mild  Post-op Assessment: Post-op Vital signs reviewed, Patient's Cardiovascular Status Stable, Respiratory Function Stable, Patent Airway and No signs of Nausea or vomiting  Post-op Vital Signs: stable  Complications: No apparent anesthesia complications

## 2012-04-03 NOTE — H&P (Signed)
Gloria Reid is an 51 y.o. female.   Chief Complaint: right breast mass HPI: known right breast mass for 5 years.  Patient now ready to have surgery.  Has been fearful of surgery in past, but now wants mass removed  Past Medical History  Diagnosis Date  . Breast mass, right     Past Surgical History  Procedure Date  . No past surgeries     No family history on file. Social History:  reports that she has never smoked. She has never used smokeless tobacco. She reports that she does not drink alcohol or use illicit drugs.  Allergies: No Known Allergies  No prescriptions prior to admission    Results for orders placed during the hospital encounter of 04/02/12 (from the past 48 hour(s))  SURGICAL PCR SCREEN     Status: Normal   Collection Time   04/02/12  9:00 AM      Component Value Range Comment   MRSA, PCR NEGATIVE  NEGATIVE    Staphylococcus aureus NEGATIVE  NEGATIVE   CBC     Status: Normal   Collection Time   04/02/12  9:05 AM      Component Value Range Comment   WBC 5.6  4.0 - 10.5 K/uL    RBC 4.90  3.87 - 5.11 MIL/uL    Hemoglobin 14.1  12.0 - 15.0 g/dL    HCT 82.9  56.2 - 13.0 %    MCV 84.3  78.0 - 100.0 fL    MCH 28.8  26.0 - 34.0 pg    MCHC 34.1  30.0 - 36.0 g/dL    RDW 86.5  78.4 - 69.6 %    Platelets 258  150 - 400 K/uL    No results found.  Review of Systems  All other systems reviewed and are negative.    There were no vitals taken for this visit. Physical Exam  Constitutional: She appears well-developed and well-nourished.  HENT:  Head: Normocephalic and atraumatic.  Eyes: Pupils are equal, round, and reactive to light.  Neck: Normal range of motion. Neck supple.  Cardiovascular: Normal rate, regular rhythm, normal heart sounds and intact distal pulses.   No murmur heard. Respiratory: Effort normal and breath sounds normal. No respiratory distress. She has no wheezes.   Breast:  Mass in right breast near the axilla, 2-3 cm in  size  Assessment/Plan Right breast mass  Excision of right breast mass is recommended.  Risks discussed in detail, including, but not limited to bleeding, infection, recurrence, need for further surgery should malignancy be present, etc.  Likelihood of success is good.  Jakavion Bilodeau A 04/03/2012, 8:07 AM

## 2012-04-03 NOTE — Anesthesia Preprocedure Evaluation (Signed)

## 2012-04-03 NOTE — Transfer of Care (Signed)
Immediate Anesthesia Transfer of Care Note  Patient: Gloria Reid  Procedure(s) Performed: Procedure(s) (LRB) with comments: EXCISION MASS (Right) - Excision Right Breast Mass  Patient Location: PACU  Anesthesia Type: General  Level of Consciousness: awake, alert  and oriented  Airway & Oxygen Therapy: Patient Spontanous Breathing and Patient connected to face mask oxygen  Post-op Assessment: Report given to PACU RN and Post -op Vital signs reviewed and stable  Post vital signs: Reviewed and stable  Complications: No apparent anesthesia complications

## 2012-04-04 ENCOUNTER — Encounter (HOSPITAL_COMMUNITY): Payer: Self-pay | Admitting: Surgery

## 2012-04-18 ENCOUNTER — Encounter (INDEPENDENT_AMBULATORY_CARE_PROVIDER_SITE_OTHER): Payer: Self-pay | Admitting: General Surgery

## 2012-04-18 ENCOUNTER — Ambulatory Visit (INDEPENDENT_AMBULATORY_CARE_PROVIDER_SITE_OTHER): Payer: BC Managed Care – PPO | Admitting: Surgery

## 2012-04-18 ENCOUNTER — Encounter (INDEPENDENT_AMBULATORY_CARE_PROVIDER_SITE_OTHER): Payer: Self-pay | Admitting: Surgery

## 2012-04-18 VITALS — BP 122/74 | HR 70 | Temp 97.8°F | Resp 16 | Ht 65.0 in | Wt 169.0 lb

## 2012-04-18 DIAGNOSIS — Z09 Encounter for follow-up examination after completed treatment for conditions other than malignant neoplasm: Secondary | ICD-10-CM

## 2012-04-18 NOTE — Progress Notes (Signed)
Subjective:     Patient ID: Gloria Reid, female   DOB: [birth year], 51 y.o.   MRN: [id number]  HPI She is here for her first postop visit status post excision of a right breast mass. She is doing well and has no complaints  Review of Systems     Objective:   Physical Exam The incision is healing well and the right breast. The final pathology showed benign breast tissue with no evidence of malignancy    Assessment:     Patient stable postop    Plan:     I will see her back as needed. She may resume normal activities. She may return to work.

## 2012-04-22 ENCOUNTER — Other Ambulatory Visit: Payer: Self-pay | Admitting: Obstetrics and Gynecology

## 2012-04-22 DIAGNOSIS — Z1231 Encounter for screening mammogram for malignant neoplasm of breast: Secondary | ICD-10-CM

## 2012-05-13 ENCOUNTER — Ambulatory Visit
Admission: RE | Admit: 2012-05-13 | Discharge: 2012-05-13 | Disposition: A | Discharge status: Home / Self Care | Payer: BC Managed Care – PPO | Source: Ambulatory Visit | Attending: Obstetrics and Gynecology | Admitting: Obstetrics and Gynecology

## 2012-05-13 DIAGNOSIS — Z1231 Encounter for screening mammogram for malignant neoplasm of breast: Secondary | ICD-10-CM

## 2012-05-31 ENCOUNTER — Other Ambulatory Visit: Payer: Self-pay | Admitting: Obstetrics and Gynecology

## 2012-05-31 DIAGNOSIS — R928 Other abnormal and inconclusive findings on diagnostic imaging of breast: Secondary | ICD-10-CM

## 2012-06-07 ENCOUNTER — Other Ambulatory Visit: Payer: BC Managed Care – PPO

## 2012-06-27 ENCOUNTER — Other Ambulatory Visit: Payer: BC Managed Care – PPO

## 2012-08-30 ENCOUNTER — Other Ambulatory Visit (INDEPENDENT_AMBULATORY_CARE_PROVIDER_SITE_OTHER): Payer: BC Managed Care – PPO

## 2012-08-30 DIAGNOSIS — Z0000 Encounter for general adult medical examination without abnormal findings: Secondary | ICD-10-CM

## 2012-08-30 LAB — POCT URINALYSIS DIPSTICK
Bilirubin, UA: NEGATIVE
Glucose, UA: NEGATIVE
Ketones, UA: NEGATIVE
Nitrite, UA: NEGATIVE
Protein, UA: NEGATIVE
Spec Grav, UA: 1.02
Urobilinogen, UA: 0.2
pH, UA: 6

## 2012-08-30 LAB — CBC WITH DIFFERENTIAL/PLATELET
Basophils Absolute: 0 10*3/uL (ref 0.0–0.1)
Basophils Relative: 0.6 % (ref 0.0–3.0)
Eosinophils Absolute: 0.2 10*3/uL (ref 0.0–0.7)
Lymphocytes Relative: 25.3 % (ref 12.0–46.0)
MCHC: 33.6 g/dL (ref 30.0–36.0)
Neutrophils Relative %: 65.6 % (ref 43.0–77.0)
Platelets: 287 10*3/uL (ref 150.0–400.0)
RBC: 5.1 Mil/uL (ref 3.87–5.11)
WBC: 5.4 10*3/uL (ref 4.5–10.5)

## 2012-08-30 LAB — BASIC METABOLIC PANEL
CO2: 26 mEq/L (ref 19–32)
Calcium: 9.6 mg/dL (ref 8.4–10.5)
Creatinine, Ser: 0.9 mg/dL (ref 0.4–1.2)

## 2012-08-30 LAB — HEPATIC FUNCTION PANEL
Alkaline Phosphatase: 101 U/L (ref 39–117)
Bilirubin, Direct: 0 mg/dL (ref 0.0–0.3)
Total Bilirubin: 0.5 mg/dL (ref 0.3–1.2)
Total Protein: 7.5 g/dL (ref 6.0–8.3)

## 2012-08-30 LAB — LDL CHOLESTEROL, DIRECT: Direct LDL: 116.2 mg/dL

## 2012-09-06 ENCOUNTER — Encounter: Payer: Self-pay | Admitting: Internal Medicine

## 2012-09-06 ENCOUNTER — Ambulatory Visit (INDEPENDENT_AMBULATORY_CARE_PROVIDER_SITE_OTHER): Payer: BC Managed Care – PPO | Admitting: Internal Medicine

## 2012-09-06 VITALS — BP 142/82 | HR 80 | Temp 99.0°F | Ht 66.0 in | Wt 170.0 lb

## 2012-09-06 DIAGNOSIS — Z0000 Encounter for general adult medical examination without abnormal findings: Secondary | ICD-10-CM

## 2012-09-06 NOTE — Assessment & Plan Note (Signed)
Reviewed adult health maintenance protocols.  Refer for colonoscopy.  PAP and Pelvic exam scheduled with GYN.  Patient advised to consider shingles vaccine. I encouraged regular exercise and weight loss.

## 2012-09-06 NOTE — Progress Notes (Signed)
  Subjective:    Patient ID: Gloria Reid, female    DOB: 10/29/1960, 52 y.o.   MRN: [id number]  HPI  52 year old [place] American female for routine preventative care visit. She was seen by surgeon for right breast mass last fall. She underwent lumpectomy. It showed benign breast tissue. She denies any other significant interval medical history.  Her weight is stable. She exercises regularly.  Screening labs reviewed.  She is followed by oncologist for routine Pap and pelvic. She is due for colon cancer screening.  Review of Systems  Constitutional: Negative for activity change, appetite change and unexpected weight change.  Eyes: Negative for visual disturbance.  Respiratory: Negative for cough, chest tightness and shortness of breath.   Cardiovascular: Negative for chest pain.  Genitourinary: Negative for difficulty urinating.  Neurological: Negative for headaches.  Gastrointestinal: Negative for abdominal pain, heartburn melena or hematochezia Psych: Negative for depression or anxiety     Past Medical History  Diagnosis Date  . Breast mass, right     S/P Biopsy 03/2012 - Benign    History   Social History  . Marital Status: Married    Spouse Name: N/A    Number of Children: N/A  . Years of Education: N/A   Occupational History  . Customer service Other    [employer]   Social History Main Topics  . Smoking status: Never Smoker   . Smokeless tobacco: Never Used  . Alcohol Use: No  . Drug Use: No  . Sexually Active: Not on file   Other Topics Concern  . Not on file   Social History Narrative  . No narrative on file    Past Surgical History  Procedure Laterality Date  . Mass excision  04/03/2012    Procedure: EXCISION MASS;  Surgeon: Shelly Rubenstein, MD;  Location: [facility];  Service: General;  Laterality: Right;  Excision Right Breast Mass    No family history on file.  No Known Allergies  No current outpatient prescriptions on file prior to visit.    No current facility-administered medications on file prior to visit.    BP 142/82  Pulse 80  Temp(Src) 99 F (37.2 C) (Oral)  Ht 5\' 6"  (1.676 m)  Wt 170 lb (77.111 kg)  BMI 27.45 kg/m2       Objective:   Physical Exam  Constitutional: She is oriented to person, place, and time. She appears well-developed and well-nourished.  HENT:  Head: Normocephalic and atraumatic.  Right Ear: External ear normal.  Left Ear: External ear normal.  Mouth/Throat: Oropharynx is clear and moist.  Neck: Normal range of motion. Neck supple.  No carotid bruit  Cardiovascular: Normal rate, regular rhythm and normal heart sounds.   Pulmonary/Chest: Effort normal and breath sounds normal. She has no wheezes.  Abdominal: Soft. Bowel sounds are normal. She exhibits no mass. There is no tenderness.  Musculoskeletal: Normal range of motion. She exhibits no edema.  Neurological: She is alert and oriented to person, place, and time. No cranial nerve deficit.  Skin: Skin is warm and dry.  Psychiatric: She has a normal mood and affect. Her behavior is normal.          Assessment & Plan:

## 2012-09-06 NOTE — Patient Instructions (Addendum)
Please monitor your blood pressure at home as directed Call our with your blood pressure readings in 1 month

## 2012-10-18 ENCOUNTER — Encounter: Payer: Self-pay | Admitting: Internal Medicine

## 2013-09-03 ENCOUNTER — Other Ambulatory Visit (INDEPENDENT_AMBULATORY_CARE_PROVIDER_SITE_OTHER): Payer: BC Managed Care – PPO

## 2013-09-03 DIAGNOSIS — Z0000 Encounter for general adult medical examination without abnormal findings: Secondary | ICD-10-CM

## 2013-09-03 LAB — CBC WITH DIFFERENTIAL/PLATELET
BASOS ABS: 0 10*3/uL (ref 0.0–0.1)
Basophils Relative: 0.4 % (ref 0.0–3.0)
EOS PCT: 4.1 % (ref 0.0–5.0)
Eosinophils Absolute: 0.2 10*3/uL (ref 0.0–0.7)
HCT: 42.5 % (ref 36.0–46.0)
Hemoglobin: 13.9 g/dL (ref 12.0–15.0)
LYMPHS ABS: 1.6 10*3/uL (ref 0.7–4.0)
Lymphocytes Relative: 30.4 % (ref 12.0–46.0)
MCHC: 32.7 g/dL (ref 30.0–36.0)
MCV: 86.7 fl (ref 78.0–100.0)
MONO ABS: 0.3 10*3/uL (ref 0.1–1.0)
MONOS PCT: 6.6 % (ref 3.0–12.0)
NEUTROS PCT: 58.5 % (ref 43.0–77.0)
Neutro Abs: 3.1 10*3/uL (ref 1.4–7.7)
PLATELETS: 273 10*3/uL (ref 150.0–400.0)
RBC: 4.9 Mil/uL (ref 3.87–5.11)
RDW: 13.7 % (ref 11.5–14.6)
WBC: 5.3 10*3/uL (ref 4.5–10.5)

## 2013-09-03 LAB — POCT URINALYSIS DIPSTICK
BILIRUBIN UA: NEGATIVE
Blood, UA: NEGATIVE
GLUCOSE UA: NEGATIVE
KETONES UA: NEGATIVE
LEUKOCYTES UA: NEGATIVE
NITRITE UA: NEGATIVE
PH UA: 5.5
Protein, UA: NEGATIVE
Spec Grav, UA: 1.02
Urobilinogen, UA: 0.2

## 2013-09-03 LAB — BASIC METABOLIC PANEL
BUN: 13 mg/dL (ref 6–23)
CO2: 26 mEq/L (ref 19–32)
CREATININE: 0.7 mg/dL (ref 0.4–1.2)
Calcium: 9.5 mg/dL (ref 8.4–10.5)
Chloride: 107 mEq/L (ref 96–112)
GFR: 106.84 mL/min (ref >60.00)
GLUCOSE: 98 mg/dL (ref 70–99)
Potassium: 3.6 mEq/L (ref 3.5–5.1)
Sodium: 140 mEq/L (ref 135–145)

## 2013-09-03 LAB — HEPATIC FUNCTION PANEL
ALT: 16 U/L (ref 0–35)
AST: 15 U/L (ref 0–37)
Albumin: 4 g/dL (ref 3.5–5.2)
Alkaline Phosphatase: 105 U/L (ref 39–117)
BILIRUBIN DIRECT: 0 mg/dL (ref 0.0–0.3)
BILIRUBIN TOTAL: 0.4 mg/dL (ref 0.3–1.2)
Total Protein: 7.3 g/dL (ref 6.0–8.3)

## 2013-09-03 LAB — LIPID PANEL
CHOLESTEROL: 203 mg/dL — AB (ref 0–200)
HDL: 72.4 mg/dL (ref >39.00)
TRIGLYCERIDES: 47 mg/dL (ref 0.0–149.0)
Total CHOL/HDL Ratio: 3
VLDL: 9.4 mg/dL (ref 0.0–40.0)

## 2013-09-03 LAB — LDL CHOLESTEROL, DIRECT: Direct LDL: 119.9 mg/dL

## 2013-09-03 LAB — TSH: TSH: 1.56 u[IU]/mL (ref 0.35–5.50)

## 2013-09-08 ENCOUNTER — Encounter: Payer: BC Managed Care – PPO | Admitting: Internal Medicine

## 2013-09-15 ENCOUNTER — Encounter: Payer: Self-pay | Admitting: Internal Medicine

## 2013-09-15 ENCOUNTER — Ambulatory Visit (INDEPENDENT_AMBULATORY_CARE_PROVIDER_SITE_OTHER): Payer: BC Managed Care – PPO | Admitting: Internal Medicine

## 2013-09-15 VITALS — BP 114/80 | HR 72 | Temp 98.6°F | Ht 66.25 in | Wt 174.0 lb

## 2013-09-15 DIAGNOSIS — Z0000 Encounter for general adult medical examination without abnormal findings: Secondary | ICD-10-CM

## 2013-09-15 NOTE — Progress Notes (Signed)
   Subjective:    Patient ID: Gloria Reid, female    DOB: 1961-01-31, 53 y.o.   MRN: [id number]  HPI  53 year old [place] American female for routine physical. She denies any significant interval medical history. Patient reports she completed colonoscopy last year. He was completed at [facility]. Patient reports colonoscopy was completely normal. The colonoscopy recommended in 10 years.  Patient retired from [employer]. 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  She stays physically active.  Social history and family history reviewed and updated.  Review of Systems  Constitutional: Negative for activity change, appetite change and unexpected weight change.  Eyes: Negative for visual disturbance.  Respiratory: Negative for cough, chest tightness and shortness of breath.   Cardiovascular: Negative for chest pain.  Genitourinary: Negative for difficulty urinating.  Neurological: Negative for headaches.  Gastrointestinal: Negative for abdominal pain, heartburn melena or hematochezia Psych: Negative for depression or anxiety Endo:  Mild weight gain. Musculoskeletal: negative for joint pains    Past Medical History  Diagnosis Date  . Breast mass, right     S/P Biopsy 03/2012 - Benign    History   Social History  . Marital Status: Married    Spouse Name: N/A    Number of Children: N/A  . Years of Education: N/A   Occupational History  . Customer service Other    [employer]   Social History Main Topics  . Smoking status: Never Smoker   . Smokeless tobacco: Never Used  . Alcohol Use: No  . Drug Use: No  . Sexual Activity: Not on file   Other Topics Concern  . Not on file   Social History Narrative  . No narrative on file    Past Surgical History  Procedure Laterality Date  . Mass excision  04/03/2012    Procedure: EXCISION MASS;  Surgeon: Harl Bowie, MD;  Location: [facility];  Service: General;  Laterality: Right;  Excision  Right Breast Mass    No family history on file.  No Known Allergies  No current outpatient prescriptions on file prior to visit.   No current facility-administered medications on file prior to visit.    BP 138/80  Pulse 72  Temp(Src) 98.6 F (37 C) (Oral)  Ht 5' 6.25" (1.683 m)  Wt 174 lb (78.926 kg)  BMI 27.86 kg/m2     Objective:   Physical Exam   Constitutional: Appears well-developed and well-nourished. No distress.  Head: Normocephalic and atraumatic.  Ear:  Right and left ear normal.  TMs clear.  Hearing is grossly normal Mouth/Throat: Oropharynx is clear and moist.  Eyes: Conjunctivae are normal. Pupils are equal, round, and reactive to light.  Neck: Normal range of motion. Neck supple. No thyromegaly present. No carotid bruit Cardiovascular: Normal rate, regular rhythm and normal heart sounds.  Exam reveals no gallop and no friction rub.  No murmur heard. Pulmonary/Chest: Effort normal and breath sounds normal.  No wheezes. No rales.  Abdominal: Soft. Bowel sounds are normal. No mass. There is no tenderness.  Neurological: Alert. No cranial nerve deficit.  Skin: Skin is warm and dry.  Psychiatric: Normal mood and affect. Behavior is normal.        Assessment & Plan:

## 2013-09-15 NOTE — Assessment & Plan Note (Signed)
Reviewed adult health maintenance protocols.  Patient completed colonoscopy in 2014. Obtain copy her medical physicians. Patient planning to repeat a surveillance mammogram this month. She is followed by her GYN for routine Pap and pelvic.  I encouraged weight loss-goal weight 160 pounds.  Screening labs reviewed in detail. Handout for low saturated fat diet provided.

## 2013-09-15 NOTE — Patient Instructions (Signed)
Goal weight loss 10-15 lbs (160 lbs) Continue regular exercise program Follow up with your GYN for PAP and Pelvic You are due for mammogram this year. Please forward copy of your colonoscopy to our office. Please complete the following lab tests before your next follow up appointment: CPX labs

## 2013-12-18 ENCOUNTER — Telehealth: Payer: Self-pay | Admitting: Internal Medicine

## 2013-12-18 NOTE — Telephone Encounter (Signed)
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  There are no other records including 2D Echo which documents whether MVP is present.  However, even if she had MVP she does not require antibiotics before dental procedure according to revised guidelines.

## 2013-12-18 NOTE — Telephone Encounter (Signed)
Pt needs a note stating she no longer has MVP and does not need abx before dental treatment attn [facility]

## 2013-12-22 NOTE — Telephone Encounter (Signed)
Violeta called to see if a letter had been faxed over for pt.  Pt needs this ASAP then an appt can be scheduled.  Pls fax to [phone]

## 2013-12-23 ENCOUNTER — Encounter: Payer: Self-pay | Admitting: *Deleted

## 2013-12-23 NOTE — Telephone Encounter (Signed)
Letter printed, Constance Holster faxed

## 2013-12-23 NOTE — Telephone Encounter (Signed)
Note has been fax to [place]

## 2014-09-17 ENCOUNTER — Other Ambulatory Visit (INDEPENDENT_AMBULATORY_CARE_PROVIDER_SITE_OTHER): Payer: BLUE CROSS/BLUE SHIELD

## 2014-09-17 DIAGNOSIS — Z0000 Encounter for general adult medical examination without abnormal findings: Secondary | ICD-10-CM

## 2014-09-17 LAB — CBC WITH DIFFERENTIAL/PLATELET
BASOS ABS: 0 10*3/uL (ref 0.0–0.1)
BASOS PCT: 0.6 % (ref 0.0–3.0)
Eosinophils Absolute: 0.1 10*3/uL (ref 0.0–0.7)
Eosinophils Relative: 1.8 % (ref 0.0–5.0)
HEMATOCRIT: 40 % (ref 36.0–46.0)
Hemoglobin: 13.6 g/dL (ref 12.0–15.0)
Lymphocytes Relative: 24.7 % (ref 12.0–46.0)
Lymphs Abs: 1.3 10*3/uL (ref 0.7–4.0)
MCHC: 34 g/dL (ref 30.0–36.0)
MCV: 83.5 fl (ref 78.0–100.0)
MONO ABS: 0.4 10*3/uL (ref 0.1–1.0)
MONOS PCT: 7.1 % (ref 3.0–12.0)
Neutro Abs: 3.4 10*3/uL (ref 1.4–7.7)
Neutrophils Relative %: 65.8 % (ref 43.0–77.0)
Platelets: 283 10*3/uL (ref 150.0–400.0)
RBC: 4.8 Mil/uL (ref 3.87–5.11)
RDW: 14 % (ref 11.5–15.5)
WBC: 5.1 10*3/uL (ref 4.0–10.5)

## 2014-09-17 LAB — LIPID PANEL
CHOL/HDL RATIO: 3
Cholesterol: 199 mg/dL (ref 0–200)
HDL: 70 mg/dL (ref >39.00)
LDL Cholesterol: 119 mg/dL — ABNORMAL HIGH (ref 0–99)
NonHDL: 129
Triglycerides: 50 mg/dL (ref 0.0–149.0)
VLDL: 10 mg/dL (ref 0.0–40.0)

## 2014-09-17 LAB — BASIC METABOLIC PANEL
BUN: 12 mg/dL (ref 6–23)
CALCIUM: 9.6 mg/dL (ref 8.4–10.5)
CHLORIDE: 104 meq/L (ref 96–112)
CO2: 28 meq/L (ref 19–32)
CREATININE: 0.82 mg/dL (ref 0.40–1.20)
GFR: 93.07 mL/min (ref >60.00)
GLUCOSE: 105 mg/dL — AB (ref 70–99)
POTASSIUM: 3.7 meq/L (ref 3.5–5.1)
Sodium: 139 mEq/L (ref 135–145)

## 2014-09-17 LAB — HEPATIC FUNCTION PANEL
ALT: 10 U/L (ref 0–35)
AST: 11 U/L (ref 0–37)
Albumin: 4.1 g/dL (ref 3.5–5.2)
Alkaline Phosphatase: 115 U/L (ref 39–117)
BILIRUBIN DIRECT: 0.1 mg/dL (ref 0.0–0.3)
Total Bilirubin: 0.4 mg/dL (ref 0.2–1.2)
Total Protein: 7.1 g/dL (ref 6.0–8.3)

## 2014-09-17 LAB — POCT URINALYSIS DIPSTICK
Bilirubin, UA: NEGATIVE
Blood, UA: NEGATIVE
GLUCOSE UA: NEGATIVE
KETONES UA: NEGATIVE
LEUKOCYTES UA: NEGATIVE
Nitrite, UA: NEGATIVE
PH UA: 6.5
Protein, UA: NEGATIVE
Spec Grav, UA: <=1.005
UROBILINOGEN UA: 0.2

## 2014-09-17 LAB — TSH: TSH: 1.03 u[IU]/mL (ref 0.35–4.50)

## 2014-09-23 ENCOUNTER — Encounter: Payer: Self-pay | Admitting: Internal Medicine

## 2014-09-23 ENCOUNTER — Ambulatory Visit (INDEPENDENT_AMBULATORY_CARE_PROVIDER_SITE_OTHER): Payer: BLUE CROSS/BLUE SHIELD | Admitting: Internal Medicine

## 2014-09-23 VITALS — BP 124/78 | HR 72 | Temp 98.3°F | Ht 64.25 in | Wt 167.0 lb

## 2014-09-23 DIAGNOSIS — Z0000 Encounter for general adult medical examination without abnormal findings: Secondary | ICD-10-CM

## 2014-09-23 DIAGNOSIS — R0789 Other chest pain: Secondary | ICD-10-CM

## 2014-09-23 NOTE — Progress Notes (Signed)
Pre visit review using our clinic review tool, if applicable. No additional management support is needed unless otherwise documented below in the visit note. 

## 2014-09-23 NOTE — Patient Instructions (Signed)
Ask your insurance company about whether shingles vaccine is covered.  (Zostavax) Please complete the following lab tests before your next follow up appointment: CPX

## 2014-09-23 NOTE — Assessment & Plan Note (Addendum)
Reviewed adult health maintenance protocols.  Patient reports she will receive her tetanus and diphtheria vaccine through her employer. She reports her screening colonoscopy performed last year by [facility] was unremarkable. Her mammograms followed by her gynecologist.  Screening blood tests reviewed in detail with patient.  She complains of intermittent atypical nonexertional chest pain. Her EKG is unremarkable. Her history not suggestive of cardiac ischemia.  Patient to report persistent or worsening symptoms. No further workup for now.

## 2014-09-23 NOTE — Progress Notes (Signed)
   Subjective:    Patient ID: Gloria Reid, female    DOB: 04-08-1961, 53 y.o.   MRN: [id number]  HPI   54 year old African-American female for routine physical. Patient denies any significant interval medical history.  Patient has put pursuit of nursing education on hold to take care of her mother. Her mother recently hospitalized for severe complications of small bowel obstruction. She required ICU management.  She mentions intermittent episodes of nonexertional chest pain. There is no temporal pattern.  She denies any associated shortness of breath.  Review of Systems     Constitutional: Negative for activity change, appetite change and unexpected weight change.  Eyes: Negative for visual disturbance.  Respiratory: Negative for cough, chest tightness and shortness of breath.   Cardiovascular: Non exertional chest pains, no assoc shortness of breathGenitourinary: Negative for difficulty urinating.  Neurological: Negative for headaches.  Gastrointestinal: Negative for abdominal pain, heartburn melena or hematochezia Psych: Negative for depression or anxiety Endo:  No polyuria or polydypsia     Past Medical History  Diagnosis Date  . Breast mass, right     S/P Biopsy 03/2012 - Benign    History   Social History  . Marital Status: Married    Spouse Name: N/A  . Number of Children: N/A  . Years of Education: N/A   Occupational History  . 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 Smoking status: Never Smoker   . Smokeless tobacco: Never Used  . Alcohol Use: No  . Drug Use: No  . Sexual Activity: Not on file   Other Topics Concern  . Not on file   Social History Narrative   Patient retired from severe   Patient enrolled in nursing school part time.      Fam Hx   Neg hx of diabetes, cancer, CAD, or stroke    Past Surgical History  Procedure Laterality Date  . Mass excision  04/03/2012    Procedure: EXCISION MASS;  Surgeon: Harl Bowie, MD;  Location: [facility];  Service: General;  Laterality: Right;  Excision Right Breast Mass    No family history on file.  No Known Allergies  No current outpatient prescriptions on file prior to visit.   No current facility-administered medications on file prior to visit.    BP 124/78 mmHg  Pulse 72  Temp(Src) 98.3 F (36.8 C) (Oral)  Ht 5' 4.25" (1.632 m)  Wt 167 lb (75.751 kg)  BMI 28.44 kg/m2    Objective:   Physical Exam   Constitutional: Appears well-developed and well-nourished. No distress.  Head: Normocephalic and atraumatic.  Ear:  Right and left ear normal.  TMs clear.  Hearing is grossly normal Mouth/Throat: Oropharynx is clear and moist.  Eyes: Conjunctivae are normal. Pupils are equal, round, and reactive to light.  Neck: Normal range of motion. Neck supple. No thyromegaly present. No carotid bruit Cardiovascular: Normal rate, regular rhythm and normal heart sounds.  Exam reveals no gallop and no friction rub.  No murmur heard. Pulmonary/Chest: Effort normal and breath sounds normal.  No wheezes. No rales.  Abdominal: Soft. Bowel sounds are normal. No mass. There is no tenderness.  Neurological: Alert. No cranial nerve deficit.  Skin: Skin is warm and dry.  Psychiatric: Normal mood and affect. Behavior is normal.        Assessment & Plan:

## 2014-09-29 ENCOUNTER — Telehealth: Payer: Self-pay | Admitting: *Deleted

## 2014-09-29 NOTE — Telephone Encounter (Signed)
-----   Message from [place], DO sent at 09/23/2014  3:38 PM EDT ----- Please call [facility] to get copy of colonoscopy

## 2014-09-29 NOTE — Telephone Encounter (Signed)
Gloria Reid is faxing it

## 2014-11-25 ENCOUNTER — Other Ambulatory Visit: Payer: Self-pay | Admitting: Obstetrics and Gynecology

## 2014-11-26 LAB — CYTOLOGY - PAP

## 2014-11-30 ENCOUNTER — Telehealth: Payer: Self-pay

## 2014-11-30 NOTE — Telephone Encounter (Signed)
Left message for pt to call back concerning need for mammogram 

## 2015-08-25 ENCOUNTER — Emergency Department (HOSPITAL_COMMUNITY)
Admission: EM | Admit: 2015-08-25 | Discharge: 2015-08-26 | Disposition: A | Discharge status: Home / Self Care | Payer: 59 | Attending: Emergency Medicine | Admitting: Emergency Medicine

## 2015-08-25 ENCOUNTER — Encounter (HOSPITAL_COMMUNITY): Payer: Self-pay | Admitting: Emergency Medicine

## 2015-08-25 DIAGNOSIS — S3992XA Unspecified injury of lower back, initial encounter: Secondary | ICD-10-CM | POA: Diagnosis present

## 2015-08-25 DIAGNOSIS — S39012A Strain of muscle, fascia and tendon of lower back, initial encounter: Secondary | ICD-10-CM | POA: Insufficient documentation

## 2015-08-25 DIAGNOSIS — T148XXA Other injury of unspecified body region, initial encounter: Secondary | ICD-10-CM

## 2015-08-25 DIAGNOSIS — Z8742 Personal history of other diseases of the female genital tract: Secondary | ICD-10-CM | POA: Insufficient documentation

## 2015-08-25 DIAGNOSIS — S80811A Abrasion, right lower leg, initial encounter: Secondary | ICD-10-CM | POA: Insufficient documentation

## 2015-08-25 DIAGNOSIS — Y92410 Unspecified street and highway as the place of occurrence of the external cause: Secondary | ICD-10-CM | POA: Diagnosis not present

## 2015-08-25 DIAGNOSIS — Y998 Other external cause status: Secondary | ICD-10-CM | POA: Insufficient documentation

## 2015-08-25 DIAGNOSIS — T148 Other injury of unspecified body region: Secondary | ICD-10-CM | POA: Insufficient documentation

## 2015-08-25 DIAGNOSIS — Y9389 Activity, other specified: Secondary | ICD-10-CM | POA: Diagnosis not present
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
## 2015-08-26 ENCOUNTER — Emergency Department (HOSPITAL_COMMUNITY): Payer: 59

## 2015-08-26 DIAGNOSIS — S39012A Strain of muscle, fascia and tendon of lower back, initial encounter: Secondary | ICD-10-CM | POA: Diagnosis not present

## 2015-08-26 MED ORDER — CYCLOBENZAPRINE HCL 10 MG PO TABS: 10.0000 mg | ORAL_TABLET | Freq: Three times a day (TID) | ORAL | Status: DC | PRN

## 2015-08-26 MED ORDER — IBUPROFEN 800 MG PO TABS: 800.0000 mg | ORAL_TABLET | Freq: Three times a day (TID) | ORAL | Status: DC
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
## 2015-08-26 NOTE — Discharge Instructions (Signed)

## 2015-08-26 NOTE — ED Notes (Signed)
Pt instructed to change into gown for Xray. Warm blankets given

## 2015-08-26 NOTE — ED Notes (Signed)
Pt ambulated with a steady gait and no difficulty

## 2016-01-19 ENCOUNTER — Encounter: Payer: BLUE CROSS/BLUE SHIELD | Admitting: Family Medicine

## 2016-06-09 ENCOUNTER — Encounter: Payer: BLUE CROSS/BLUE SHIELD | Admitting: Family Medicine

## 2016-06-09 ENCOUNTER — Telehealth: Payer: Self-pay | Admitting: *Deleted

## 2016-06-09 ENCOUNTER — Encounter: Payer: Self-pay | Admitting: Family Medicine

## 2016-06-09 DIAGNOSIS — Z0289 Encounter for other administrative examinations: Secondary | ICD-10-CM

## 2016-06-09 NOTE — Telephone Encounter (Signed)
Patient was a no-show for today's appt.  She stated forgot about todays appt and I rescheduled her for 1/8.

## 2016-06-09 NOTE — Progress Notes (Deleted)
HPI:  Gloria Reid is here to establish care and for her preventive exam/labs.  She used to see Dr. Shawna Orleans and has not seen anyone else since. Hx mild hyperlipidemia. Labs done last year with PCP. Last PCP and physical: Colon ca screening: colonoscopy 2014 with [facility] normal per PCP notes Mammogram: [facility] 09/2012 per notes with recs for < 1 year repeat - but appears lost to follow up Pap/pelvic/breast exams: sees gynecologist, Arvella Nigh, last pap normal 11/2014  Due for: hep c, HIV, mammo, Tdap, flu vaccine  ROS negative for unless reported above: fevers, unintentional weight loss, hearing or vision loss, chest pain, palpitations, struggling to breath, hemoptysis, melena, hematochezia, hematuria, falls, loc, si, thoughts of self harm  Past Medical History:  Diagnosis Date  . Breast mass, right    S/P Biopsy 03/2012 - Benign    Past Surgical History:  Procedure Laterality Date  . MASS EXCISION  04/03/2012   Procedure: EXCISION MASS;  Surgeon: Harl Bowie, MD;  Location: [facility];  Service: General;  Laterality: Right;  Excision Right Breast Mass    Family History  Problem Relation Age of Onset  . CAD Other     Social History   Social History  . Marital status: Married    Spouse name: N/A  . Number of children: N/A  . Years of education: N/A   Occupational History  . 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 Smoking status: Never Smoker  . Smokeless tobacco: Never Used  . Alcohol use No  . Drug use: No  . Sexual activity: Not on file   Other Topics Concern  . Not on file   Social History Narrative   Patient retired from severe   Patient enrolled in nursing school part time.      Fam Hx   Neg hx of diabetes, cancer, CAD, or stroke     Current Outpatient Prescriptions:  .  cyclobenzaprine (FLEXERIL) 10 MG tablet, Take 1 tablet (10 mg total) by mouth 3 (three) times daily as needed for muscle spasms., Disp: 20 tablet, Rfl:  0 .  ibuprofen (ADVIL,MOTRIN) 800 MG tablet, Take 1 tablet (800 mg total) by mouth 3 (three) times daily., Disp: 21 tablet, Rfl: 0 .  loratadine (CLARITIN) 10 MG tablet, Take 10 mg by mouth daily as needed for allergies., Disp: , Rfl:   EXAM:  There were no vitals filed for this visit.  There is no height or weight on file to calculate BMI.  GENERAL: vitals reviewed and listed above, alert, oriented, appears well hydrated and in no acute distress  HEENT: atraumatic, conjunttiva clear, no obvious abnormalities on inspection of external nose and ears  NECK: no obvious masses on inspection  LUNGS: clear to auscultation bilaterally, no wheezes, rales or rhonchi, good air movement  CV: HRRR, no peripheral edema  MS: moves all extremities without noticeable abnormality  PSYCH: pleasant and cooperative, no obvious depression or anxiety  ASSESSMENT AND PLAN:  Discussed the following assessment and plan:  No diagnosis found. -We reviewed the PMH, PSH, FH, SH, Meds and Allergies. -We provided refills for any medications we will prescribe as needed. -We addressed current concerns per orders and patient instructions. -We have asked for records for pertinent exams, studies, vaccines and notes from previous providers. -We have advised patient to follow up per instructions below.   -Patient advised to return or notify a doctor immediately if symptoms worsen or persist or  new concerns arise.  There are no Patient Instructions on file for this visit.   Colin Benton R.

## 2016-07-17 ENCOUNTER — Encounter: Payer: 59 | Admitting: Family Medicine

## 2016-09-18 NOTE — Progress Notes (Deleted)
   HPI:  Gloria Reid is here to establish care. Prior patient of Dr. Shawna Orleans, but not seen in this office in several years. [facility]. Last pap in chart 11/2014, normal. Has the following chronic problems that require follow up and concerns today:   ROS negative for unless reported above: fevers, unintentional weight loss, hearing or vision loss, chest pain, palpitations, struggling to breath, hemoptysis, melena, hematochezia, hematuria, falls, loc, si, thoughts of self harm  Past Medical History:  Diagnosis Date  . Breast mass, right    S/P Biopsy 03/2012 - Benign  . Open-angle glaucoma     Past Surgical History:  Procedure Laterality Date  . MASS EXCISION  04/03/2012   Procedure: EXCISION MASS;  Surgeon: Harl Bowie, MD;  Location: [facility];  Service: General;  Laterality: Right;  Excision Right Breast Mass    Family History  Problem Relation Age of Onset  . CAD Other     Social History   Social History  . Marital status: Married    Spouse name: N/A  . Number of children: N/A  . Years of education: N/A   Occupational History  . 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 Smoking status: Never Smoker  . Smokeless tobacco: Never Used  . Alcohol use No  . Drug use: No  . Sexual activity: Not on file   Other Topics Concern  . Not on file   Social History Narrative      Patient enrolled in nursing school part time.      Fam Hx   Neg hx of diabetes, cancer, CAD, or stroke     Current Outpatient Prescriptions:  .  cyclobenzaprine (FLEXERIL) 10 MG tablet, Take 1 tablet (10 mg total) by mouth 3 (three) times daily as needed for muscle spasms., Disp: 20 tablet, Rfl: 0 .  ibuprofen (ADVIL,MOTRIN) 800 MG tablet, Take 1 tablet (800 mg total) by mouth 3 (three) times daily., Disp: 21 tablet, Rfl: 0 .  loratadine (CLARITIN) 10 MG tablet, Take 10 mg by mouth daily as needed for allergies., Disp: , Rfl:    EXAM:  There were no vitals filed for this visit.  There is no height or weight on file to calculate BMI.  GENERAL: vitals reviewed and listed above, alert, oriented, appears well hydrated and in no acute distress  HEENT: atraumatic, conjunttiva clear, no obvious abnormalities on inspection of external nose and ears  NECK: no obvious masses on inspection  LUNGS: clear to auscultation bilaterally, no wheezes, rales or rhonchi, good air movement  CV: HRRR, no peripheral edema  MS: moves all extremities without noticeable abnormality  PSYCH: pleasant and cooperative, no obvious depression or anxiety  ASSESSMENT AND PLAN:  Discussed the following assessment and plan:  No diagnosis found. -We reviewed the PMH, PSH, FH, SH, Meds and Allergies. -We provided refills for any medications we will prescribe as needed. -We addressed current concerns per orders and patient instructions. -We have asked for records for pertinent exams, studies, vaccines and notes from previous providers. -We have advised patient to follow up per instructions below.   -Patient advised to return or notify a doctor immediately if symptoms worsen or persist or new concerns arise.  There are no Patient Instructions on file for this visit.   Colin Benton R.

## 2016-09-19 ENCOUNTER — Encounter: Payer: BLUE CROSS/BLUE SHIELD | Admitting: Family Medicine

## 2016-09-19 ENCOUNTER — Encounter: Payer: Self-pay | Admitting: Family Medicine

## 2016-09-19 NOTE — Progress Notes (Signed)
NO SHOW for NPV/CPE.

## 2016-09-21 ENCOUNTER — Encounter: Payer: 59 | Admitting: Family Medicine

## 2017-06-01 NOTE — Progress Notes (Signed)
HPI:  Gloria Reid is here to establish care and for her physical.  She used to see Dr. Shawna Orleans.  She denies any health problems or complaints today.  She does want to do better with her exercise, as she has not been getting regular exercise.  She reports she usually eats healthy, though she strayed from a healthy diet over the Thanksgiving holiday. Sees Dr. Renold Don yearly for her GYN physical, pelvic exam, breast exam and mammograms. She is up-to-date on her Pap smear, mammogram and colonoscopy. She does want to get basic cholesterol and blood sugar screening today.   ROS negative for unless reported above: fevers, unintentional weight loss, hearing or vision loss, chest pain, palpitations, struggling to breath, hemoptysis, melena, hematochezia, hematuria, falls, loc, si, thoughts of self harm  Past Medical History:  Diagnosis Date  . Breast mass, right    S/P Biopsy 03/2012 - Benign  . Open-angle glaucoma     Past Surgical History:  Procedure Laterality Date  . MASS EXCISION  04/03/2012   Procedure: EXCISION MASS;  Surgeon: Harl Bowie, MD;  Location: [facility];  Service: General;  Laterality: Right;  Excision Right Breast Mass    No family history on file.  Social History   Socioeconomic History  . Marital status: Married    Spouse name: None  . Number of children: None  . Years of education: None  . Highest education level: None  Social Needs  . Financial resource strain: None  . Food insecurity - worry: None  . Food insecurity - inability: None  . Transportation needs - medical: None  . Transportation needs - non-medical: None  Occupational History  . Occupation: Retired    Fish farm manager: OTHER    Comment: [employer]  Tobacco Use  . Smoking status: Never Smoker  . Smokeless tobacco: Never Used  Substance and Sexual Activity  . Alcohol use: No  . Drug use: No  . Sexual activity: None  Other Topics Concern  . 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   Fam Hx   Neg hx of diabetes, cancer, CAD, or stroke    No current outpatient medications on file.  EXAM:  Vitals:   06/04/17 0816  BP: 102/70  Pulse: 81  Temp: 98.7 F (37.1 C)    Body mass index is 29.01 kg/m.  GENERAL: vitals reviewed and listed above, alert, oriented, appears well hydrated and in no acute distress  HEENT: atraumatic, conjunttiva clear, no obvious abnormalities on inspection of external nose and ears  NECK: no obvious masses on inspection  LUNGS: clear to auscultation bilaterally, no wheezes, rales or rhonchi, good air movement  CV: HRRR, no peripheral edema  MS: moves all extremities without noticeable abnormality  PSYCH: pleasant and cooperative, no obvious depression or anxiety  ASSESSMENT AND PLAN:  Discussed the following assessment and plan:  Encounter for preventative adult health care examination - Plan: Lipid panel, Hemoglobin A1c  BMI 29.0-29.9,adult  Encounter for hepatitis C virus screening test for high risk patient - Plan: Hepatitis C antibody -We reviewed the PMH, PSH, FH, SH, Meds and Allergies. -Health maintenance for her age/demographics reviewed and updated -fasting labs per orders -We have asked for records for pertinent exams, studies, vaccines and notes from previous providers. -We have advised patient to follow up per instructions below.   -Patient advised to return or notify a doctor immediately if symptoms worsen or persist or  new concerns arise.  Patient Instructions  BEFORE YOU LEAVE: -PHQ9 - enter in epic -Print colonoscopy report for her, she is not due until 2024 per the recommendations from the colonoscopy report -Labs -follow up: In 1 year for physical and as needed  Make sure that you take vitamin D3 [phone] international units daily  Take sure to get regular aerobic exercise, try to get outside daily.  Eat a healthy low sugar diet.  Avoid processed foods as much  as possible.  We have ordered labs or studies at this visit. It can take up to 1-2 weeks for results and processing. IF results require follow up or explanation, we will call you with instructions. Clinically stable results will be released to your [facility]. If you have not heard from [place] or cannot find your results in [facility] in 2 weeks please contact our office at [phone].  If you are not yet signed up for [facility], please consider signing up.   Health Maintenance for Postmenopausal Women Menopause is a normal process in which your reproductive ability comes to an end. This process happens gradually over a span of months to years, usually between the ages of 4 and 49. Menopause is complete when you have missed 12 consecutive menstrual periods. It is important to talk with your health care provider about some of the most common conditions that affect postmenopausal women, such as heart disease, cancer, and bone loss (osteoporosis). Adopting a healthy lifestyle and getting preventive care can help to promote your health and wellness. Those actions can also lower your chances of developing some of these common conditions. What should I know about menopause? During menopause, you may experience a number of symptoms, such as:  Moderate-to-severe hot flashes.  Night sweats.  Decrease in sex drive.  Mood swings.  Headaches.  Tiredness.  Irritability.  Memory problems.  Insomnia.  Choosing to treat or not to treat menopausal changes is an individual decision that you make with your health care provider. What should I know about hormone replacement therapy and supplements? Hormone therapy products are effective for treating symptoms that are associated with menopause, such as hot flashes and night sweats. Hormone replacement carries certain risks, especially as you become older. If you are thinking about using estrogen or estrogen with progestin treatments, discuss the benefits and risks  with your health care provider. What should I know about heart disease and stroke? Heart disease, heart attack, and stroke become more likely as you age. This may be due, in part, to the hormonal changes that your body experiences during menopause. These can affect how your body processes dietary fats, triglycerides, and cholesterol. Heart attack and stroke are both medical emergencies. There are many things that you can do to help prevent heart disease and stroke:  Have your blood pressure checked at least every 1-2 years. High blood pressure causes heart disease and increases the risk of stroke.  If you are 29-31 years old, ask your health care provider if you should take aspirin to prevent a heart attack or a stroke.  Do not use any tobacco products, including cigarettes, chewing tobacco, or electronic cigarettes. If you need help quitting, ask your health care provider.  It is important to eat a healthy diet and maintain a healthy weight. ? Be sure to include plenty of vegetables, fruits, low-fat dairy products, and lean protein. ? Avoid eating foods that are high in solid fats, added sugars, or salt (sodium).  Get regular exercise. This is one of  the most important things that you can do for your health. ? Try to exercise for at least 150 minutes each week. The type of exercise that you do should increase your heart rate and make you sweat. This is known as moderate-intensity exercise. ? Try to do strengthening exercises at least twice each week. Do these in addition to the moderate-intensity exercise.  Know your numbers.Ask your health care provider to check your cholesterol and your blood glucose. Continue to have your blood tested as directed by your health care provider.  What should I know about cancer screening? There are several types of cancer. Take the following steps to reduce your risk and to catch any cancer development as early as possible. Breast Cancer  Practice breast  self-awareness. ? This means understanding how your breasts normally appear and feel. ? It also means doing regular breast self-exams. Let your health care provider know about any changes, no matter how small.  If you are 65 or older, have a clinician do a breast exam (clinical breast exam or CBE) every year. Depending on your age, family history, and medical history, it may be recommended that you also have a yearly breast X-ray (mammogram).  If you have a family history of breast cancer, talk with your health care provider about genetic screening.  If you are at high risk for breast cancer, talk with your health care provider about having an MRI and a mammogram every year.  Breast cancer (BRCA) gene test is recommended for women who have family members with BRCA-related cancers. Results of the assessment will determine the need for genetic counseling and BRCA1 and for BRCA2 testing. BRCA-related cancers include these types: ? Breast. This occurs in males or females. ? Ovarian. ? Tubal. This may also be called fallopian tube cancer. ? Cancer of the abdominal or pelvic lining (peritoneal cancer). ? Prostate. ? Pancreatic.  Cervical, Uterine, and Ovarian Cancer Your health care provider may recommend that you be screened regularly for cancer of the pelvic organs. These include your ovaries, uterus, and vagina. This screening involves a pelvic exam, which includes checking for microscopic changes to the surface of your cervix (Pap test).  For women ages 21-65, health care providers may recommend a pelvic exam and a Pap test every three years. For women ages 93-65, they may recommend the Pap test and pelvic exam, combined with testing for human papilloma virus (HPV), every five years. Some types of HPV increase your risk of cervical cancer. Testing for HPV may also be done on women of any age who have unclear Pap test results.  Other health care providers may not recommend any screening for  nonpregnant women who are considered low risk for pelvic cancer and have no symptoms. Ask your health care provider if a screening pelvic exam is right for you.  If you have had past treatment for cervical cancer or a condition that could lead to cancer, you need Pap tests and screening for cancer for at least 20 years after your treatment. If Pap tests have been discontinued for you, your risk factors (such as having a new sexual partner) need to be reassessed to determine if you should start having screenings again. Some women have medical problems that increase the chance of getting cervical cancer. In these cases, your health care provider may recommend that you have screening and Pap tests more often.  If you have a family history of uterine cancer or ovarian cancer, talk with your health care provider  about genetic screening.  If you have vaginal bleeding after reaching menopause, tell your health care provider.  There are currently no reliable tests available to screen for ovarian cancer.  Lung Cancer Lung cancer screening is recommended for adults 51-42 years old who are at high risk for lung cancer because of a history of smoking. A yearly low-dose CT scan of the lungs is recommended if you:  Currently smoke.  Have a history of at least 30 pack-years of smoking and you currently smoke or have quit within the past 15 years. A pack-year is smoking an average of one pack of cigarettes per day for one year.  Yearly screening should:  Continue until it has been 15 years since you quit.  Stop if you develop a health problem that would prevent you from having lung cancer treatment.  Colorectal Cancer  This type of cancer can be detected and can often be prevented.  Routine colorectal cancer screening usually begins at age 70 and continues through age 28.  If you have risk factors for colon cancer, your health care provider may recommend that you be screened at an earlier age.  If you  have a family history of colorectal cancer, talk with your health care provider about genetic screening.  Your health care provider may also recommend using home test kits to check for hidden blood in your stool.  A small camera at the end of a tube can be used to examine your colon directly (sigmoidoscopy or colonoscopy). This is done to check for the earliest forms of colorectal cancer.  Direct examination of the colon should be repeated every 5-10 years until age 26. However, if early forms of precancerous polyps or small growths are found or if you have a family history or genetic risk for colorectal cancer, you may need to be screened more often.  Skin Cancer  Check your skin from head to toe regularly.  Monitor any moles. Be sure to tell your health care provider: ? About any new moles or changes in moles, especially if there is a change in a mole's shape or color. ? If you have a mole that is larger than the size of a pencil eraser.  If any of your family members has a history of skin cancer, especially at a young age, talk with your health care provider about genetic screening.  Always use sunscreen. Apply sunscreen liberally and repeatedly throughout the day.  Whenever you are outside, protect yourself by wearing long sleeves, pants, a wide-brimmed hat, and sunglasses.  What should I know about osteoporosis? Osteoporosis is a condition in which bone destruction happens more quickly than new bone creation. After menopause, you may be at an increased risk for osteoporosis. To help prevent osteoporosis or the bone fractures that can happen because of osteoporosis, the following is recommended:  If you are 56-79 years old, get at least 1,000 mg of calcium and at least 600 mg of vitamin D per day.  If you are older than age 25 but younger than age 27, get at least 1,200 mg of calcium and at least 600 mg of vitamin D per day.  If you are older than age 76, get at least 1,200 mg of  calcium and at least 800 mg of vitamin D per day.  Smoking and excessive alcohol intake increase the risk of osteoporosis. Eat foods that are rich in calcium and vitamin D, and do weight-bearing exercises several times each week as directed by your health  care provider. What should I know about how menopause affects my mental health? Depression may occur at any age, but it is more common as you become older. Common symptoms of depression include:  Low or sad mood.  Changes in sleep patterns.  Changes in appetite or eating patterns.  Feeling an overall lack of motivation or enjoyment of activities that you previously enjoyed.  Frequent crying spells.  Talk with your health care provider if you think that you are experiencing depression. What should I know about immunizations? It is important that you get and maintain your immunizations. These include:  Tetanus, diphtheria, and pertussis (Tdap) booster vaccine.  Influenza every year before the flu season begins.  Pneumonia vaccine.  Shingles vaccine.  Your health care provider may also recommend other immunizations. This information is not intended to replace advice given to you by your health care provider. Make sure you discuss any questions you have with your health care provider. Document Released: 08/18/2005 Document Revised: 01/14/2016 Document Reviewed: 03/30/2015 Elsevier Interactive Patient Education  2018 [place] NOW OFFER   [facility] FAST TRACK!!!  SAME DAY Appointments for ACUTE CARE  Such as: Sprains, Injuries, cuts, abrasions, rashes, muscle pain, joint pain, back pain Colds, flu, sore throats, headache, allergies, cough, fever  Ear pain, sinus and eye infections Abdominal pain, nausea, vomiting, diarrhea, upset stomach Animal/insect bites  3 Easy Ways to Schedule: Walk-In Scheduling Call in scheduling Mychart Sign-up:  [email or web address]                 Colin Benton R.

## 2017-06-04 ENCOUNTER — Encounter: Payer: Self-pay | Admitting: Family Medicine

## 2017-06-04 ENCOUNTER — Ambulatory Visit: Payer: BLUE CROSS/BLUE SHIELD | Admitting: Family Medicine

## 2017-06-04 VITALS — BP 102/70 | HR 81 | Temp 98.7°F | Ht 66.5 in | Wt 182.5 lb

## 2017-06-04 DIAGNOSIS — Z0000 Encounter for general adult medical examination without abnormal findings: Secondary | ICD-10-CM | POA: Diagnosis not present

## 2017-06-04 DIAGNOSIS — Z1159 Encounter for screening for other viral diseases: Secondary | ICD-10-CM | POA: Diagnosis not present

## 2017-06-04 DIAGNOSIS — E669 Obesity, unspecified: Secondary | ICD-10-CM | POA: Insufficient documentation

## 2017-06-04 DIAGNOSIS — Z9189 Other specified personal risk factors, not elsewhere classified: Secondary | ICD-10-CM | POA: Diagnosis not present

## 2017-06-04 DIAGNOSIS — Z6829 Body mass index (BMI) 29.0-29.9, adult: Secondary | ICD-10-CM

## 2017-06-04 LAB — LIPID PANEL
CHOLESTEROL: 194 mg/dL (ref 0–200)
HDL: 61.8 mg/dL (ref >39.00)
LDL Cholesterol: 123 mg/dL — ABNORMAL HIGH (ref 0–99)
NonHDL: 131.87
Total CHOL/HDL Ratio: 3
Triglycerides: 46 mg/dL (ref 0.0–149.0)
VLDL: 9.2 mg/dL (ref 0.0–40.0)

## 2017-06-04 LAB — HEMOGLOBIN A1C: Hgb A1c MFr Bld: 6.3 % (ref 4.6–6.5)

## 2017-06-04 NOTE — Patient Instructions (Addendum)
BEFORE YOU LEAVE: -PHQ9 - enter in epic -Print colonoscopy report for her, she is not due until 2024 per the recommendations from the colonoscopy report -Labs -follow up: In 1 year for physical and as needed  Make sure that you take vitamin D3 [phone] international units daily  Take sure to get regular aerobic exercise, try to get outside daily.  Eat a healthy low sugar diet.  Avoid processed foods as much as possible.  We have ordered labs or studies at this visit. It can take up to 1-2 weeks for results and processing. IF results require follow up or explanation, we will call you with instructions. Clinically stable results will be released to your [facility]. If you have not heard from [place] or cannot find your results in [facility] in 2 weeks please contact our office at [phone].  If you are not yet signed up for [facility], please consider signing up.   Health Maintenance for Postmenopausal Women Menopause is a normal process in which your reproductive ability comes to an end. This process happens gradually over a span of months to years, usually between the ages of 18 and 57. Menopause is complete when you have missed 12 consecutive menstrual periods. It is important to talk with your health care provider about some of the most common conditions that affect postmenopausal women, such as heart disease, cancer, and bone loss (osteoporosis). Adopting a healthy lifestyle and getting preventive care can help to promote your health and wellness. Those actions can also lower your chances of developing some of these common conditions. What should I know about menopause? During menopause, you may experience a number of symptoms, such as:  Moderate-to-severe hot flashes.  Night sweats.  Decrease in sex drive.  Mood swings.  Headaches.  Tiredness.  Irritability.  Memory problems.  Insomnia.  Choosing to treat or not to treat menopausal changes is an individual decision that you make with  your health care provider. What should I know about hormone replacement therapy and supplements? Hormone therapy products are effective for treating symptoms that are associated with menopause, such as hot flashes and night sweats. Hormone replacement carries certain risks, especially as you become older. If you are thinking about using estrogen or estrogen with progestin treatments, discuss the benefits and risks with your health care provider. What should I know about heart disease and stroke? Heart disease, heart attack, and stroke become more likely as you age. This may be due, in part, to the hormonal changes that your body experiences during menopause. These can affect how your body processes dietary fats, triglycerides, and cholesterol. Heart attack and stroke are both medical emergencies. There are many things that you can do to help prevent heart disease and stroke:  Have your blood pressure checked at least every 1-2 years. High blood pressure causes heart disease and increases the risk of stroke.  If you are 95-26 years old, ask your health care provider if you should take aspirin to prevent a heart attack or a stroke.  Do not use any tobacco products, including cigarettes, chewing tobacco, or electronic cigarettes. If you need help quitting, ask your health care provider.  It is important to eat a healthy diet and maintain a healthy weight. ? Be sure to include plenty of vegetables, fruits, low-fat dairy products, and lean protein. ? Avoid eating foods that are high in solid fats, added sugars, or salt (sodium).  Get regular exercise. This is one of the most important things that you can do  for your health. ? Try to exercise for at least 150 minutes each week. The type of exercise that you do should increase your heart rate and make you sweat. This is known as moderate-intensity exercise. ? Try to do strengthening exercises at least twice each week. Do these in addition to the  moderate-intensity exercise.  Know your numbers.Ask your health care provider to check your cholesterol and your blood glucose. Continue to have your blood tested as directed by your health care provider.  What should I know about cancer screening? There are several types of cancer. Take the following steps to reduce your risk and to catch any cancer development as early as possible. Breast Cancer  Practice breast self-awareness. ? This means understanding how your breasts normally appear and feel. ? It also means doing regular breast self-exams. Let your health care provider know about any changes, no matter how small.  If you are 52 or older, have a clinician do a breast exam (clinical breast exam or CBE) every year. Depending on your age, family history, and medical history, it may be recommended that you also have a yearly breast X-ray (mammogram).  If you have a family history of breast cancer, talk with your health care provider about genetic screening.  If you are at high risk for breast cancer, talk with your health care provider about having an MRI and a mammogram every year.  Breast cancer (BRCA) gene test is recommended for women who have family members with BRCA-related cancers. Results of the assessment will determine the need for genetic counseling and BRCA1 and for BRCA2 testing. BRCA-related cancers include these types: ? Breast. This occurs in males or females. ? Ovarian. ? Tubal. This may also be called fallopian tube cancer. ? Cancer of the abdominal or pelvic lining (peritoneal cancer). ? Prostate. ? Pancreatic.  Cervical, Uterine, and Ovarian Cancer Your health care provider may recommend that you be screened regularly for cancer of the pelvic organs. These include your ovaries, uterus, and vagina. This screening involves a pelvic exam, which includes checking for microscopic changes to the surface of your cervix (Pap test).  For women ages 21-65, health care  providers may recommend a pelvic exam and a Pap test every three years. For women ages 31-65, they may recommend the Pap test and pelvic exam, combined with testing for human papilloma virus (HPV), every five years. Some types of HPV increase your risk of cervical cancer. Testing for HPV may also be done on women of any age who have unclear Pap test results.  Other health care providers may not recommend any screening for nonpregnant women who are considered low risk for pelvic cancer and have no symptoms. Ask your health care provider if a screening pelvic exam is right for you.  If you have had past treatment for cervical cancer or a condition that could lead to cancer, you need Pap tests and screening for cancer for at least 20 years after your treatment. If Pap tests have been discontinued for you, your risk factors (such as having a new sexual partner) need to be reassessed to determine if you should start having screenings again. Some women have medical problems that increase the chance of getting cervical cancer. In these cases, your health care provider may recommend that you have screening and Pap tests more often.  If you have a family history of uterine cancer or ovarian cancer, talk with your health care provider about genetic screening.  If you have vaginal  bleeding after reaching menopause, tell your health care provider.  There are currently no reliable tests available to screen for ovarian cancer.  Lung Cancer Lung cancer screening is recommended for adults 36-67 years old who are at high risk for lung cancer because of a history of smoking. A yearly low-dose CT scan of the lungs is recommended if you:  Currently smoke.  Have a history of at least 30 pack-years of smoking and you currently smoke or have quit within the past 15 years. A pack-year is smoking an average of one pack of cigarettes per day for one year.  Yearly screening should:  Continue until it has been 15 years  since you quit.  Stop if you develop a health problem that would prevent you from having lung cancer treatment.  Colorectal Cancer  This type of cancer can be detected and can often be prevented.  Routine colorectal cancer screening usually begins at age 18 and continues through age 57.  If you have risk factors for colon cancer, your health care provider may recommend that you be screened at an earlier age.  If you have a family history of colorectal cancer, talk with your health care provider about genetic screening.  Your health care provider may also recommend using home test kits to check for hidden blood in your stool.  A small camera at the end of a tube can be used to examine your colon directly (sigmoidoscopy or colonoscopy). This is done to check for the earliest forms of colorectal cancer.  Direct examination of the colon should be repeated every 5-10 years until age 84. However, if early forms of precancerous polyps or small growths are found or if you have a family history or genetic risk for colorectal cancer, you may need to be screened more often.  Skin Cancer  Check your skin from head to toe regularly.  Monitor any moles. Be sure to tell your health care provider: ? About any new moles or changes in moles, especially if there is a change in a mole's shape or color. ? If you have a mole that is larger than the size of a pencil eraser.  If any of your family members has a history of skin cancer, especially at a young age, talk with your health care provider about genetic screening.  Always use sunscreen. Apply sunscreen liberally and repeatedly throughout the day.  Whenever you are outside, protect yourself by wearing long sleeves, pants, a wide-brimmed hat, and sunglasses.  What should I know about osteoporosis? Osteoporosis is a condition in which bone destruction happens more quickly than new bone creation. After menopause, you may be at an increased risk for  osteoporosis. To help prevent osteoporosis or the bone fractures that can happen because of osteoporosis, the following is recommended:  If you are 71-31 years old, get at least 1,000 mg of calcium and at least 600 mg of vitamin D per day.  If you are older than age 82 but younger than age 28, get at least 1,200 mg of calcium and at least 600 mg of vitamin D per day.  If you are older than age 34, get at least 1,200 mg of calcium and at least 800 mg of vitamin D per day.  Smoking and excessive alcohol intake increase the risk of osteoporosis. Eat foods that are rich in calcium and vitamin D, and do weight-bearing exercises several times each week as directed by your health care provider. What should I know about how  menopause affects my mental health? Depression may occur at any age, but it is more common as you become older. Common symptoms of depression include:  Low or sad mood.  Changes in sleep patterns.  Changes in appetite or eating patterns.  Feeling an overall lack of motivation or enjoyment of activities that you previously enjoyed.  Frequent crying spells.  Talk with your health care provider if you think that you are experiencing depression. What should I know about immunizations? It is important that you get and maintain your immunizations. These include:  Tetanus, diphtheria, and pertussis (Tdap) booster vaccine.  Influenza every year before the flu season begins.  Pneumonia vaccine.  Shingles vaccine.  Your health care provider may also recommend other immunizations. This information is not intended to replace advice given to you by your health care provider. Make sure you discuss any questions you have with your health care provider. Document Released: 08/18/2005 Document Revised: 01/14/2016 Document Reviewed: 03/30/2015 Elsevier Interactive Patient Education  2018 [employer]   [facility] FAST TRACK!!!  SAME DAY Appointments for  ACUTE CARE  Such as: Sprains, Injuries, cuts, abrasions, rashes, muscle pain, joint pain, back pain Colds, flu, sore throats, headache, allergies, cough, fever  Ear pain, sinus and eye infections Abdominal pain, nausea, vomiting, diarrhea, upset stomach Animal/insect bites  3 Easy Ways to Schedule: Walk-In Scheduling Call in scheduling Mychart Sign-up: [email or web address]

## 2017-06-05 LAB — HEPATITIS C ANTIBODY
Hepatitis C Ab: NONREACTIVE
SIGNAL TO CUT-OFF: 0.03 (ref <1.00)

## 2017-06-15 ENCOUNTER — Encounter: Payer: Self-pay | Admitting: *Deleted

## 2018-01-18 MED FILL — PREMARIN VAGINAL CREAM-APPL: 0.625 | 53 days supply | Qty: 30 | Fill #0

## 2018-01-24 MED FILL — AZITHROMYCIN 250 MG TABLET: 250 | 5 days supply | Qty: 6 | Fill #0

## 2018-03-17 NOTE — Progress Notes (Signed)
HPI:  Using dictation device. Unfortunately this device frequently misinterprets words/phrases.  Here for CPE.   -Concerns and/or follow up today:   Toenail fungus. Thinks go it from nail salon. Several toenails R foot with thickened and friable nails. Prefers to treat without systemic medication if possible.  Reports did her mammogram and pap with Dr. Conni Reid in April.  Declines flu shot as prefers to do at work.  Chronic medical problems summarized below were reviewed for changes.She has a history of elevated BMI, prediabetes and hyperlipidemia. Lifestyle changes were advised in the past. Reports doing much better. Eating much healthier and starting to train for a walking 5k.  -Taking folic acid, vitamin D or calcium: no -Diabetes and Dyslipidemia Screening: fasting for labs -Vaccines: see vaccine section EPIC -pap history: Sees Dr. Radene Reid -FDLMP: see nursing notes -sexual activity: yes, female partner, no new partners -wants STI testing (Hep C if born [birth year]): no -FH breast, colon or ovarian ca: see FH Last mammogram: in the past done with Dr. Radene Reid Last colon cancer screening: colonoscopy 10/2012 Breast Ca Risk Assessment: see family history and pt history DEXA (>/= 65):n/a  -Alcohol, Tobacco, drug use: see social history  Review of Systems - no fevers, unintentional weight loss, vision loss, hearing loss, chest pain, sob, hemoptysis, melena, hematochezia, hematuria, genital discharge, changing or concerning skin lesions, bleeding, bruising, loc, thoughts of self harm or SI  Past Medical History:  Diagnosis Date  . Breast mass, right    S/P Biopsy 03/2012 - Benign  . Open-angle glaucoma     Past Surgical History:  Procedure Laterality Date  . MASS EXCISION  04/03/2012   Procedure: EXCISION MASS;  Surgeon: Gloria Bowie, MD;  Location: [facility];  Service: General;  Laterality: Right;  Excision Right Breast Mass    History reviewed. No pertinent family  history.  Social History   Socioeconomic History  . Marital status: Married    Spouse name: Not on file  . Number of children: Not on file  . Years of education: Not on file  . Highest education level: Not on file  Occupational History  . Occupation: Retired    Fish farm manager: OTHER    Comment: [employer]  Social Needs  . Financial resource strain: Not on file  . Food insecurity:    Worry: Not on file    Inability: Not on file  . Transportation needs:    Medical: Not on file    Non-medical: Not on file  Tobacco Use  . Smoking status: Never Smoker  . Smokeless tobacco: Never Used  Substance and Sexual Activity  . Alcohol use: No  . Drug use: No  . Sexual activity: Not on file  Lifestyle  . Physical activity:    Days per week: Not on file    Minutes per session: Not on file  . Stress: Not on file  Relationships  . Social connections:    Talks on phone: Not on file    Gets together: Not on file    Attends religious service: Not on file    Active member of club or organization: Not on file    Attends meetings of clubs or organizations: Not on file    Relationship status: Not on file  Other Topics Concern  . 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   Fam Hx   Neg hx of diabetes, cancer, CAD, or stroke  No current outpatient medications on file.  EXAM:  Vitals:   03/18/18 1045  BP: 96/78  Pulse: 80  Temp: 98.2 F (36.8 C)   Body mass index is 28.63 kg/m. GENERAL: vitals reviewed and listed below, alert, oriented, appears well hydrated and in no acute distress  HEENT: head atraumatic, PERRLA, normal appearance of eyes, ears, nose and mouth. moist mucus membranes.  NECK: supple, no masses or lymphadenopathy  LUNGS: clear to auscultation bilaterally, no rales, rhonchi or wheeze  CV: HRRR, no peripheral edema or cyanosis, normal pedal pulses  ABDOMEN: bowel sounds normal, soft, non tender to palpation, no  masses, no rebound or guarding  GU/BREAST: declined, does with gyn  SKIN: no rash or abnormal lesions, thickened friable nails R foot - 3 nails  MS: normal gait, moves all extremities normally  NEURO: normal gait, speech and thought processing grossly intact, muscle tone grossly intact throughout  PSYCH: normal affect, pleasant and cooperative  ASSESSMENT AND PLAN:  Discussed the following assessment and plan:  PREVENTIVE EXAM: -Discussed and advised all [place] preventive services health task force level A and B recommendations for age, sex and risks. -Advised at least 150 minutes of exercise per week and a healthy diet with avoidance of (less then 1 serving per week) processed foods, white starches, red meat, fast foods and sweets and consisting of: * 5-9 servings of fresh fruits and vegetables (not corn or potatoes) *nuts and seeds, beans *olives and olive oil *lean meats such as fish and white chicken  *whole grains -labs, studies and vaccines per orders this encounter  Discussed treatment options for the nail fungus. She prefers to try keeping nails trim, filed and dry and treating with topical antifungal first. Will return if needed.  Patient advised to return to clinic immediately if symptoms worsen or persist or new concerns.  Patient Instructions  BEFORE YOU LEAVE: -Gloria Reid, Update pap a mammogram in health maintenance - she did with Dr. Radene Reid -labs -follow MW:NUUVOZ for physical  We have ordered labs or studies at this visit. It can take up to 1-2 weeks for results and processing. IF results require follow up or explanation, we will call you with instructions. Clinically stable results will be released to your [facility]. If you have not heard from [place] or cannot find your results in [facility] in 2 weeks please contact our office at [phone].  If you are not yet signed up for [facility], please consider signing up.   We recommend the following healthy lifestyle for LIFE: 1)  Small portions. But, make sure to get regular (at least 3 per day), healthy meals and small healthy snacks if needed.  2) Eat a healthy clean diet.   TRY TO EAT: -at least 5-7 servings of low sugar, colorful, and nutrient rich vegetables per day (not corn, potatoes or bananas.) -berries are the best choice if you wish to eat fruit (only eat small amounts if trying to reduce weight)  -lean meets (fish, white meat of chicken or [place]) -vegan proteins for some meals - beans or tofu, whole grains, nuts and seeds -Replace bad fats with good fats - good fats include: fish, nuts and seeds, canola oil, olive oil -small amounts of low fat or non fat dairy -small amounts of100 % whole grains - check the lables -drink plenty of water  AVOID: -SUGAR, sweets, anything with added sugar, corn syrup or sweeteners - must read labels as even foods advertised as "healthy" often are loaded with sugar -if you  must have a sweetener, small amounts of stevia may be best -sweetened beverages and artificially sweetened beverages -simple starches (rice, bread, potatoes, pasta, chips, etc - small amounts of 100% whole grains are ok) -red meat, pork, butter -fried foods, fast food, processed food, excessive dairy, eggs and coconut.  3)Get at least 150 minutes of sweaty aerobic exercise per week.  4)Reduce stress - consider counseling, meditation and relaxation to balance other aspects of your life.          No follow-ups on file.  Gloria Kern, DO

## 2018-03-18 ENCOUNTER — Ambulatory Visit (INDEPENDENT_AMBULATORY_CARE_PROVIDER_SITE_OTHER): Payer: PRIVATE HEALTH INSURANCE | Admitting: Family Medicine

## 2018-03-18 ENCOUNTER — Encounter: Payer: Self-pay | Admitting: Family Medicine

## 2018-03-18 VITALS — BP 96/78 | HR 80 | Temp 98.2°F | Ht 66.0 in | Wt 177.4 lb

## 2018-03-18 DIAGNOSIS — Z0000 Encounter for general adult medical examination without abnormal findings: Secondary | ICD-10-CM

## 2018-03-18 DIAGNOSIS — Z1331 Encounter for screening for depression: Secondary | ICD-10-CM | POA: Diagnosis not present

## 2018-03-18 LAB — LIPID PANEL
CHOL/HDL RATIO: 3
Cholesterol: 193 mg/dL (ref 0–200)
HDL: 65.9 mg/dL (ref >39.00)
LDL Cholesterol: 117 mg/dL — ABNORMAL HIGH (ref 0–99)
NONHDL: 127.12
Triglycerides: 52 mg/dL (ref 0.0–149.0)
VLDL: 10.4 mg/dL (ref 0.0–40.0)

## 2018-03-18 LAB — HEMOGLOBIN A1C: HEMOGLOBIN A1C: 6.3 % (ref 4.6–6.5)

## 2018-03-18 NOTE — Patient Instructions (Signed)
BEFORE YOU LEAVE: -[name], Update pap a mammogram in health maintenance - she did with Dr. Radene Knee -labs -follow PR:FFMBWG for physical  We have ordered labs or studies at this visit. It can take up to 1-2 weeks for results and processing. IF results require follow up or explanation, we will call you with instructions. Clinically stable results will be released to your [facility]. If you have not heard from [place] or cannot find your results in [facility] in 2 weeks please contact our office at [phone].  If you are not yet signed up for [facility], please consider signing up.   We recommend the following healthy lifestyle for LIFE: 1) Small portions. But, make sure to get regular (at least 3 per day), healthy meals and small healthy snacks if needed.  2) Eat a healthy clean diet.   TRY TO EAT: -at least 5-7 servings of low sugar, colorful, and nutrient rich vegetables per day (not corn, potatoes or bananas.) -berries are the best choice if you wish to eat fruit (only eat small amounts if trying to reduce weight)  -lean meets (fish, white meat of chicken or [place]) -vegan proteins for some meals - beans or tofu, whole grains, nuts and seeds -Replace bad fats with good fats - good fats include: fish, nuts and seeds, canola oil, olive oil -small amounts of low fat or non fat dairy -small amounts of100 % whole grains - check the lables -drink plenty of water  AVOID: -SUGAR, sweets, anything with added sugar, corn syrup or sweeteners - must read labels as even foods advertised as "healthy" often are loaded with sugar -if you must have a sweetener, small amounts of stevia may be best -sweetened beverages and artificially sweetened beverages -simple starches (rice, bread, potatoes, pasta, chips, etc - small amounts of 100% whole grains are ok) -red meat, pork, butter -fried foods, fast food, processed food, excessive dairy, eggs and coconut.  3)Get at least 150 minutes of sweaty aerobic exercise per  week.  4)Reduce stress - consider counseling, meditation and relaxation to balance other aspects of your life.

## 2018-03-19 LAB — HIV ANTIBODY (ROUTINE TESTING W REFLEX): HIV: NONREACTIVE

## 2018-05-09 ENCOUNTER — Emergency Department (HOSPITAL_COMMUNITY)
Admission: EM | Admit: 2018-05-09 | Discharge: 2018-05-09 | Disposition: A | Discharge status: Home / Self Care | Attending: Emergency Medicine | Admitting: Emergency Medicine

## 2018-05-09 ENCOUNTER — Emergency Department (HOSPITAL_COMMUNITY)

## 2018-05-09 ENCOUNTER — Other Ambulatory Visit: Payer: Self-pay

## 2018-05-09 DIAGNOSIS — M542 Cervicalgia: Secondary | ICD-10-CM | POA: Insufficient documentation

## 2018-05-09 DIAGNOSIS — M545 Low back pain: Secondary | ICD-10-CM | POA: Diagnosis present

## 2018-05-09 MED ORDER — DIAZEPAM 5 MG PO TABS
5.0000 mg | ORAL_TABLET | Freq: Once | ORAL | Status: AC
  Administered 2018-05-09: 5 mg via ORAL
  Filled 2018-05-09: qty 1

## 2018-05-09 MED ORDER — HYDROCODONE-ACETAMINOPHEN 5-325 MG PO TABS: 1.0000 | ORAL_TABLET | ORAL | 0 refills | Status: DC | PRN

## 2018-05-09 MED ORDER — DIAZEPAM 5 MG PO TABS: 5.0000 mg | ORAL_TABLET | Freq: Two times a day (BID) | ORAL | 0 refills | Status: DC

## 2018-05-09 MED ORDER — IBUPROFEN 600 MG PO TABS: 600.0000 mg | ORAL_TABLET | Freq: Four times a day (QID) | ORAL | 0 refills | Status: DC | PRN

## 2018-05-09 MED ORDER — KETOROLAC TROMETHAMINE 30 MG/ML IJ SOLN
30.0000 mg | Freq: Once | INTRAMUSCULAR | Status: AC
  Administered 2018-05-09: 30 mg via INTRAMUSCULAR
  Filled 2018-05-09: qty 1
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
## 2018-05-14 ENCOUNTER — Ambulatory Visit: Payer: PRIVATE HEALTH INSURANCE | Admitting: Family Medicine

## 2018-05-14 ENCOUNTER — Encounter: Payer: Self-pay | Admitting: *Deleted

## 2018-05-14 ENCOUNTER — Ambulatory Visit (INDEPENDENT_AMBULATORY_CARE_PROVIDER_SITE_OTHER): Payer: PRIVATE HEALTH INSURANCE | Admitting: Family Medicine

## 2018-05-14 ENCOUNTER — Encounter: Payer: Self-pay | Admitting: Family Medicine

## 2018-05-14 VITALS — BP 120/70 | HR 90 | Temp 98.2°F | Ht 67.0 in | Wt 175.8 lb

## 2018-05-14 DIAGNOSIS — M542 Cervicalgia: Secondary | ICD-10-CM | POA: Diagnosis not present

## 2018-05-14 DIAGNOSIS — M545 Low back pain, unspecified: Secondary | ICD-10-CM

## 2018-05-14 DIAGNOSIS — M549 Dorsalgia, unspecified: Secondary | ICD-10-CM

## 2018-05-14 MED ORDER — TIZANIDINE HCL 2 MG PO TABS: 2.0000 mg | ORAL_TABLET | Freq: Every evening | ORAL | 0 refills | Status: DC | PRN
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
## 2018-05-14 NOTE — Patient Instructions (Signed)
BEFORE YOU LEAVE: -whiplash and low back exercises -note seen today and may return to work tomorrow -follow up:  1 month  STOP the valium and the norco and dispose of properly if no longer needed.  Do the exercises at home - alternate  Heat, Topical menthol (tiger balm) and tylenol or aleve per instructions as needed for pain.  Tizanidine (muscle relaxer) as needed before bed for spasm.  I hope you are feeling better soon! Seek care promptly if your symptoms worsen, new concerns arise or you are not improving with treatment.

## 2018-06-11 ENCOUNTER — Encounter: Payer: Self-pay | Admitting: Family Medicine

## 2018-06-11 ENCOUNTER — Ambulatory Visit (INDEPENDENT_AMBULATORY_CARE_PROVIDER_SITE_OTHER): Payer: PRIVATE HEALTH INSURANCE | Admitting: Family Medicine

## 2018-06-11 VITALS — BP 112/74 | HR 93 | Temp 98.3°F | Ht 67.0 in | Wt 178.9 lb

## 2018-06-11 DIAGNOSIS — R030 Elevated blood-pressure reading, without diagnosis of hypertension: Secondary | ICD-10-CM | POA: Diagnosis not present

## 2018-06-11 DIAGNOSIS — M545 Low back pain, unspecified: Secondary | ICD-10-CM

## 2018-06-11 NOTE — Patient Instructions (Signed)
BEFORE YOU LEAVE -follow up: 1 month  -We placed a referral for you as discussed to physical therapy. It usually takes about 1-2 weeks to process and schedule this referral. If you have not heard from [place] regarding this appointment in 2 weeks please contact our office.  -you may use heat, aleve or tylenol per instructions as needed for pain  I hope you are feeling better soon! Seek care promptly if your symptoms worsen, new concerns arise or you are not improving with treatment.     We recommend the following healthy lifestyle for LIFE: 1) Small portions. But, make sure to get regular (at least 3 per day), healthy meals and small healthy snacks if needed.  2) Eat a healthy clean diet.   TRY TO EAT: -at least 5-7 servings of low sugar, colorful, and nutrient rich vegetables per day (not corn, potatoes or bananas.) -berries are the best choice if you wish to eat fruit (only eat small amounts if trying to reduce weight)  -lean meets (fish, white meat of chicken or [place]) -vegan proteins for some meals - beans or tofu, whole grains, nuts and seeds -Replace bad fats with good fats - good fats include: fish, nuts and seeds, canola oil, olive oil -small amounts of low fat or non fat dairy -small amounts of100 % whole grains - check the lables -drink plenty of water  AVOID: -SUGAR, sweets, anything with added sugar, corn syrup or sweeteners - must read labels as even foods advertised as "healthy" often are loaded with sugar -if you must have a sweetener, small amounts of stevia may be best -sweetened beverages and artificially sweetened beverages -simple starches (rice, bread, potatoes, pasta, chips, etc - small amounts of 100% whole grains are ok) -red meat, pork, butter -fried foods, fast food, processed food, excessive dairy, eggs and coconut.  3)Get at least 150 minutes of sweaty aerobic exercise per week.  4)Reduce stress - consider counseling, meditation and relaxation to balance  other aspects of your life.
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
## 2018-06-12 ENCOUNTER — Encounter: Payer: Self-pay | Admitting: Physical Therapy

## 2018-06-12 ENCOUNTER — Other Ambulatory Visit: Payer: Self-pay

## 2018-06-12 ENCOUNTER — Ambulatory Visit: Attending: Family Medicine | Admitting: Physical Therapy

## 2018-06-12 DIAGNOSIS — R252 Cramp and spasm: Secondary | ICD-10-CM

## 2018-06-12 DIAGNOSIS — M545 Low back pain, unspecified: Secondary | ICD-10-CM

## 2018-06-12 DIAGNOSIS — M6281 Muscle weakness (generalized): Secondary | ICD-10-CM
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
## 2018-06-12 NOTE — Patient Instructions (Signed)

## 2018-06-17 ENCOUNTER — Encounter: Payer: Self-pay | Admitting: Physical Therapy

## 2018-06-17 ENCOUNTER — Ambulatory Visit: Admitting: Physical Therapy

## 2018-06-17 DIAGNOSIS — M545 Low back pain, unspecified: Secondary | ICD-10-CM

## 2018-06-17 DIAGNOSIS — M6281 Muscle weakness (generalized): Secondary | ICD-10-CM

## 2018-06-17 DIAGNOSIS — R252 Cramp and spasm: Secondary | ICD-10-CM

## 2018-06-17 NOTE — Therapy (Signed)
[facility] [address] [place], [place], [address] Phone: [phone]   Fax:  [phone]  Physical Therapy Treatment  Patient Details  Name: Gloria Reid MRN: [id number] Date of Birth: [birth year] Referring Provider (PT): Lucretia Kern, DO   Encounter Date: 06/17/2018  PT End of Session - 06/17/18 1033    Visit Number  2    Number of Visits  16    Date for PT Re-Evaluation  08/07/18    Authorization Type  Med pay    PT Start Time  1017    PT Stop Time  1105    PT Time Calculation (min)  48 min    Activity Tolerance  Patient tolerated treatment well    Behavior During Therapy  [facility] for tasks assessed/performed       Past Medical History:  Diagnosis Date  . Breast mass, right    S/P Biopsy 03/2012 - Benign  . Open-angle glaucoma     Past Surgical History:  Procedure Laterality Date  . MASS EXCISION  04/03/2012   Procedure: EXCISION MASS;  Surgeon: Harl Bowie, MD;  Location: [facility];  Service: General;  Laterality: Right;  Excision Right Breast Mass    There were no vitals filed for this visit.  Subjective Assessment - 06/17/18 1023    Subjective  Pt arriving to therapy reporting 7/10 right sided low back pain. Pt reporting no radiation today. Pt reporting not taking meds prior to therapy. Pt reporting her HEP has been doing her exercises once daily.     Limitations  Sitting    Patient Stated Goals  get rid of pain    Currently in Pain?  Yes    Pain Score  7     Pain Location  Back    Pain Orientation  Right;Lower    Pain Descriptors / Indicators  Aching;Sore    Pain Type  Chronic pain    Pain Onset  More than a month ago    Pain Frequency  Constant                       [facility] Adult PT Treatment/Exercise - 06/17/18 0001      Exercises   Exercises  Lumbar      Lumbar Exercises: Stretches   Active Hamstring Stretch  Right;Left;3 reps;30 seconds    Single Knee to Chest  Stretch  Right;Left;3 reps;20 seconds      Lumbar Exercises: Seated   Hip Flexion on Ball  AROM;Strengthening;15 reps;Limitations    Hip Flexion on Ball Limitations  needed tactile cues to maintain anterior pelvic tilt and posture    Other Seated Lumbar Exercises  Rows on therapy ball with red band x 15, seated marching on ball x 20 reps    Other Seated Lumbar Exercises  pelvic tilts on ball x 20 pt needed constant verbal cues and tactile cues      Lumbar Exercises: Supine   Clam  15 reps;3 seconds    Straight Leg Raise  10 reps    Other Supine Lumbar Exercises  hip adduction with ball between the knees x 15 holding 5 seconds each      Modalities   Modalities  Moist Heat      Moist Heat Therapy   Number Minutes Moist Heat  8 Minutes    Moist Heat Location  Lumbar Spine             PT Education -  06/17/18 1032    Education Details  exercise techniques and breathing with exercises    Person(s) Educated  Patient    Methods  Explanation;Demonstration;Verbal cues;Tactile cues    Comprehension  Verbalized understanding;Returned demonstration;Need further instruction       PT Short Term Goals - 06/17/18 1415      PT SHORT TERM GOAL #1   Title  ind with initial HEP    Time  4    Period  Weeks    Status  On-going      PT SHORT TERM GOAL #2   Title  reports at least 30% less pain overall    Time  4    Period  Weeks        PT Long Term Goals - 06/17/18 1050      PT LONG TERM GOAL #1   Title  ind with advanced HEP    Time  8    Period  Weeks    Status  On-going      PT LONG TERM GOAL #2   Title  reports 75% less pain in low back    Time  8    Period  Weeks    Status  New      PT LONG TERM GOAL #3   Title  able to lift all normal objects at home without icreased pain due to improved core strength    Time  8    Period  Weeks      PT LONG TERM GOAL #4   Title  pt will be able to sit as long as needed for normal daily activities and recreational activities  without increases in pain    Time  8    Period  Weeks    Status  New            Plan - 06/17/18 1034    Clinical Impression Statement  Pt arriving to therapy reporting LBP more on the right side today. Pt tolerating all exercises well, reporting less pain (5/10) after stretching. Pt needed constant verbal instructions for exercise techniques and breathing techniques. Pt had difficulty disassociating her pelvis and perfomring pelvic tilts on the ball was a challenge. Continue with skilled PT to prorgress toward pt's LTG's.     PT Frequency  2x / week    PT Duration  8 weeks    PT Treatment/Interventions  ADLs/Self Care Home Management;Biofeedback;Cryotherapy;Electrical Stimulation;Moist Heat;Traction;Gait training;Therapeutic activities;Therapeutic exercise;Neuromuscular re-education;Patient/family education;Manual techniques;Taping;Dry needling;Passive range of motion    PT Next Visit Plan  DN to lumbar and glutes; lumbar and hip ROM, posture and core strength    PT Home Exercise Plan   Access Code: [email or web address], added standing rows with red band and anterior and posterior pelvic tilts.     Consulted and Agree with Plan of Care  Patient       Patient will benefit from skilled therapeutic intervention in order to improve the following deficits and impairments:  Increased muscle spasms, Decreased strength, Impaired tone, Decreased range of motion, Impaired flexibility, Increased fascial restricitons, Postural dysfunction, Pain  Visit Diagnosis: Acute midline low back pain without sciatica  Muscle weakness (generalized)  Cramp and spasm     Problem List Patient Active Problem List   Diagnosis Date Noted  . BMI 29.0-29.9,adult 06/04/2017  . MITRAL VALVE PROLAPSE 05/25/2007    Edmonia Lynch Jahlil Ziller,PT 06/17/2018, 2:18 PM  [facility] [address] [place], [place], [address] Phone: [phone]  Fax:   [phone]  Name: Gloria Reid MRN: [id number] Date of Birth: 11-30-1960

## 2018-06-21 ENCOUNTER — Encounter: Payer: PRIVATE HEALTH INSURANCE | Admitting: Physical Therapy

## 2018-06-25 ENCOUNTER — Ambulatory Visit: Admitting: Physical Therapy

## 2018-06-25 DIAGNOSIS — R252 Cramp and spasm: Secondary | ICD-10-CM

## 2018-06-25 DIAGNOSIS — M545 Low back pain, unspecified: Secondary | ICD-10-CM

## 2018-06-25 DIAGNOSIS — M6281 Muscle weakness (generalized): Secondary | ICD-10-CM

## 2018-06-25 NOTE — Therapy (Signed)
[facility] [address] [place], [place], [address] Phone: [phone]   Fax:  [phone]  Physical Therapy Treatment  Patient Details  Name: Gloria Reid MRN: [id number] Date of Birth: 13-Apr-1961 Referring Provider (PT): Lucretia Kern, DO   Encounter Date: 06/25/2018  PT End of Session - 06/25/18 1355    Visit Number  3    Number of Visits  16    Date for PT Re-Evaluation  08/07/18    Authorization Type  Med pay    PT Start Time  1155   arrived late   PT Stop Time  1227    PT Time Calculation (min)  32 min    Activity Tolerance  Patient tolerated treatment well    Behavior During Therapy  [facility] for tasks assessed/performed       Past Medical History:  Diagnosis Date  . Breast mass, right    S/P Biopsy 03/2012 - Benign  . Open-angle glaucoma     Past Surgical History:  Procedure Laterality Date  . MASS EXCISION  04/03/2012   Procedure: EXCISION MASS;  Surgeon: Harl Bowie, MD;  Location: [facility];  Service: General;  Laterality: Right;  Excision Right Breast Mass    There were no vitals filed for this visit.  Subjective Assessment - 06/25/18 1157    Subjective  Pt states the pain is not as severe as last time and less frequent.  She reports able to sit for 2.5 hours up from 40 minutes    Limitations  Sitting    How long can you sit comfortably?  can sit 2.5 hours    Patient Stated Goals  get rid of pain    Currently in Pain?  Yes    Pain Score  4     Pain Location  Back    Pain Orientation  Right;Lower    Pain Descriptors / Indicators  Aching    Pain Type  Chronic pain    Pain Onset  More than a month ago    Pain Frequency  Constant    Multiple Pain Sites  No                       [facility] Adult PT Treatment/Exercise - 06/25/18 0001      Neuro Re-ed    Neuro Re-ed Details   educated in correctly performing pelvic tilt and TrA engaged      Lumbar Exercises: Stretches   Active Hamstring Stretch  Right;Left;3 reps;30 seconds   standing   Single Knee to Chest Stretch  Right;Left;3 reps;20 seconds    Figure 4 Stretch  3 reps;20 seconds;With overpressure      Lumbar Exercises: Aerobic   UBE (Upper Arm Bike)  L3 2x2 - standing   PT present to discuss progress     Lumbar Exercises: Supine   Pelvic Tilt  10 reps    Clam  15 reps;3 seconds    Bent Knee Raise  20 reps;2 seconds    Large Ball Oblique Isometric  15 reps   red ball roll out              PT Short Term Goals - 06/25/18 1222      PT SHORT TERM GOAL #1   Title  ind with initial HEP    Status  Achieved      PT SHORT TERM GOAL #2   Title  reports at least 30% less pain  overall    Baseline  30-40% less pain    Status  Achieved        PT Long Term Goals - 06/25/18 1216      PT LONG TERM GOAL #1   Title  ind with advanced HEP    Status  On-going      PT LONG TERM GOAL #2   Title  reports 75% less pain in low back    Status  On-going      PT LONG TERM GOAL #3   Title  able to lift all normal objects at home without icreased pain due to improved core strength    Status  Achieved      PT LONG TERM GOAL #4   Title  pt will be able to sit as long as needed for normal daily activities and recreational activities without increases in pain    Baseline  2.5 hours    Status  On-going            Plan - 06/25/18 1221    Clinical Impression Statement  Pt is feeling 30-40% improved.  She is doing well with initial HEP.  Pt is having difficulty with TrA in standing but was able to feel muscle engaged in supine position.  She will benefit from skilled PT to continue progressing with strength and improved soft tissue.      PT Treatment/Interventions  ADLs/Self Care Home Management;Biofeedback;Cryotherapy;Electrical Stimulation;Moist Heat;Traction;Gait training;Therapeutic activities;Therapeutic exercise;Neuromuscular re-education;Patient/family education;Manual techniques;Taping;Dry  needling;Passive range of motion    PT Next Visit Plan  possible STM/DN to lumbar and glutes; lumbar and hip ROM, posture and continue TrA and core strength    PT Home Exercise Plan   Access Code: [email or web address]    Consulted and Agree with Plan of Care  Patient       Patient will benefit from skilled therapeutic intervention in order to improve the following deficits and impairments:  Increased muscle spasms, Decreased strength, Impaired tone, Decreased range of motion, Impaired flexibility, Increased fascial restricitons, Postural dysfunction, Pain  Visit Diagnosis: Acute midline low back pain without sciatica  Muscle weakness (generalized)  Cramp and spasm     Problem List Patient Active Problem List   Diagnosis Date Noted  . BMI 29.0-29.9,adult 06/04/2017  . MITRAL VALVE PROLAPSE 05/25/2007    Zannie Cove, PT 06/25/2018, 2:00 PM   [facility] [address] [place], [place], [address] Phone: [phone]   Fax:  [phone]  Name: Gloria Reid MRN: [id number] Date of Birth: 07-14-1960

## 2018-06-28 ENCOUNTER — Encounter: Payer: PRIVATE HEALTH INSURANCE | Admitting: Physical Therapy

## 2018-07-02 ENCOUNTER — Ambulatory Visit: Admitting: Physical Therapy

## 2018-07-05 ENCOUNTER — Ambulatory Visit: Admitting: Physical Therapy

## 2018-07-05 ENCOUNTER — Encounter: Payer: Self-pay | Admitting: Physical Therapy

## 2018-07-05 DIAGNOSIS — M6281 Muscle weakness (generalized): Secondary | ICD-10-CM

## 2018-07-05 DIAGNOSIS — M545 Low back pain, unspecified: Secondary | ICD-10-CM

## 2018-07-05 DIAGNOSIS — R252 Cramp and spasm: Secondary | ICD-10-CM

## 2018-07-05 NOTE — Therapy (Signed)
[facility] [address] [place], [place], [address] Phone: [phone]   Fax:  [phone]  Physical Therapy Treatment  Patient Details  Name: Gloria Reid MRN: [id number] Date of Birth: 04-Jun-1961 Referring Provider (PT): Lucretia Kern, DO   Encounter Date: 07/05/2018  PT End of Session - 07/05/18 0804    Visit Number  4    Number of Visits  16    Date for PT Re-Evaluation  08/07/18    Authorization Type  Med pay    PT Start Time  0801    PT Stop Time  0844    PT Time Calculation (min)  43 min    Activity Tolerance  Patient tolerated treatment well    Behavior During Therapy  [facility] for tasks assessed/performed       Past Medical History:  Diagnosis Date  . Breast mass, right    S/P Biopsy 03/2012 - Benign  . Open-angle glaucoma     Past Surgical History:  Procedure Laterality Date  . MASS EXCISION  04/03/2012   Procedure: EXCISION MASS;  Surgeon: Harl Bowie, MD;  Location: [facility];  Service: General;  Laterality: Right;  Excision Right Breast Mass    There were no vitals filed for this visit.  Subjective Assessment - 07/05/18 0802    Subjective  Pt states she has been stressed out and her back pain is feeling higher today.    Limitations  Sitting    Patient Stated Goals  get rid of pain    Currently in Pain?  Yes    Pain Score  6                        [facility] Adult PT Treatment/Exercise - 07/05/18 0001      Self-Care   Self-Care  Other Self-Care Comments    Other Self-Care Comments   info on TENS unit      Lumbar Exercises: Aerobic   UBE (Upper Arm Bike)  L3 3x3 - standing   PT present to discuss progress     Moist Heat Therapy   Number Minutes Moist Heat  15 Minutes    Moist Heat Location  Lumbar Spine      Electrical Stimulation   Electrical Stimulation Location  lumbar    Electrical Stimulation Action  IFC    Electrical Stimulation Parameters  to tolerance    Electrical Stimulation Goals  Pain      Manual Therapy   Manual Therapy  Soft tissue mobilization    Manual therapy comments  lumbar and lower thoracic paraspinals, gluteal attachments       Trigger Point Dry Needling - 07/05/18 0806    Consent Given?  Yes    Education Handout Provided  Yes    Muscles Treated Lower Body  --   bilat lumbar multifidi          PT Education - 07/05/18 0837    Education Details  TENS home unit information    Person(s) Educated  Patient    Methods  Explanation;Handout    Comprehension  Verbalized understanding       PT Short Term Goals - 06/25/18 1222      PT SHORT TERM GOAL #1   Title  ind with initial HEP    Status  Achieved      PT SHORT TERM GOAL #2   Title  reports at least 30% less pain overall  Baseline  30-40% less pain    Status  Achieved        PT Long Term Goals - 06/25/18 1216      PT LONG TERM GOAL #1   Title  ind with advanced HEP    Status  On-going      PT LONG TERM GOAL #2   Title  reports 75% less pain in low back    Status  On-going      PT LONG TERM GOAL #3   Title  able to lift all normal objects at home without icreased pain due to improved core strength    Status  Achieved      PT LONG TERM GOAL #4   Title  pt will be able to sit as long as needed for normal daily activities and recreational activities without increases in pain    Baseline  2.5 hours    Status  On-going            Plan - 07/05/18 0839    Clinical Impression Statement  Pt was having increased pain today.  She responded well to manual and DN techniques for increased muscle length in lumbar.  Pt will benefit from skilled PT to continue to progress improved soft tissue length and TrA and core strength.    PT Treatment/Interventions  ADLs/Self Care Home Management;Biofeedback;Cryotherapy;Electrical Stimulation;Moist Heat;Traction;Gait training;Therapeutic activities;Therapeutic exercise;Neuromuscular re-education;Patient/family  education;Manual techniques;Taping;Dry needling;Passive range of motion    PT Next Visit Plan  f/u on STM/DN to lumbar multifidi, lumbar and hip ROM, posture and continue TrA and core strength    PT Home Exercise Plan   Access Code: [email or web address]    Consulted and Agree with Plan of Care  Patient       Patient will benefit from skilled therapeutic intervention in order to improve the following deficits and impairments:  Increased muscle spasms, Decreased strength, Impaired tone, Decreased range of motion, Impaired flexibility, Increased fascial restricitons, Postural dysfunction, Pain  Visit Diagnosis: Acute midline low back pain without sciatica  Muscle weakness (generalized)  Cramp and spasm     Problem List Patient Active Problem List   Diagnosis Date Noted  . BMI 29.0-29.9,adult 06/04/2017  . MITRAL VALVE PROLAPSE 05/25/2007    Zannie Cove, PT 07/05/2018, 8:50 AM  [facility] [address] [place], [place], [address] Phone: [phone]   Fax:  [phone]  Name: Gloria Reid MRN: [id number] Date of Birth: 1961/04/08

## 2018-07-05 NOTE — Patient Instructions (Signed)
TENS 7000 can order on [facility]

## 2018-07-08 ENCOUNTER — Encounter: Payer: Self-pay | Admitting: Physical Therapy

## 2018-07-08 ENCOUNTER — Ambulatory Visit: Admitting: Physical Therapy

## 2018-07-08 DIAGNOSIS — M545 Low back pain, unspecified: Secondary | ICD-10-CM

## 2018-07-08 DIAGNOSIS — M6281 Muscle weakness (generalized): Secondary | ICD-10-CM

## 2018-07-08 DIAGNOSIS — R252 Cramp and spasm: Secondary | ICD-10-CM

## 2018-07-08 NOTE — Therapy (Signed)
[facility] [address], [place], [place], [address] Phone: [phone]   Fax:  [phone]  Physical Therapy Treatment  Patient Details  Name: Gloria Reid MRN: [id number] Date of Birth: 1960-08-29 Referring Provider (PT): Lucretia Kern, DO   Encounter Date: 07/08/2018  PT End of Session - 07/08/18 1101    Visit Number  5    Number of Visits  16    Date for PT Re-Evaluation  08/07/18    Authorization Type  Med pay    PT Start Time  1017    PT Stop Time  1105    PT Time Calculation (min)  48 min    Activity Tolerance  Patient tolerated treatment well    Behavior During Therapy  [facility] for tasks assessed/performed       Past Medical History:  Diagnosis Date  . Breast mass, right    S/P Biopsy 03/2012 - Benign  . Open-angle glaucoma     Past Surgical History:  Procedure Laterality Date  . MASS EXCISION  04/03/2012   Procedure: EXCISION MASS;  Surgeon: Harl Bowie, MD;  Location: [facility];  Service: General;  Laterality: Right;  Excision Right Breast Mass    There were no vitals filed for this visit.  Subjective Assessment - 07/08/18 1021    Subjective  Pt states she is sore today.  States she had a lot less pain after last session and it lasted through the next day    Patient Stated Goals  get rid of pain    Currently in Pain?  Yes    Pain Score  6     Pain Location  Back    Pain Orientation  Right;Left    Pain Descriptors / Indicators  Aching    Pain Type  Chronic pain    Pain Onset  More than a month ago    Pain Frequency  Constant    Multiple Pain Sites  No                       [facility] Adult PT Treatment/Exercise - 07/08/18 0001      Lumbar Exercises: Stretches   Active Hamstring Stretch  Right;Left;3 reps;30 seconds   standing   Single Knee to Chest Stretch  Right;Left;3 reps;20 seconds      Lumbar Exercises: Standing   Shoulder Extension  Strengthening;20  reps;Theraband    Theraband Level (Shoulder Extension)  Level 2 (Red)    Shoulder Extension Limitations  cues to engage TrA and keep pelvis neutral    Other Standing Lumbar Exercises  pallof punch for isometric core - red band - 20 x each way    Other Standing Lumbar Exercises  wall push up 20x flat surface; 20x using ball      Moist Heat Therapy   Number Minutes Moist Heat  15 Minutes    Moist Heat Location  Lumbar Spine      Electrical Stimulation   Electrical Stimulation Location  lumbar    Electrical Stimulation Action  IFC    Electrical Stimulation Parameters  to tolerance    Electrical Stimulation Goals  Pain      Manual Therapy   Manual Therapy  Soft tissue mobilization    Manual therapy comments  lumbar and lower thoracic paraspinals, gluteal attachments       Trigger Point Dry Needling - 07/08/18 1052    Consent Given?  Yes    Muscles Treated  Lower Body  --   lumbar multifidi            PT Short Term Goals - 06/25/18 1222      PT SHORT TERM GOAL #1   Title  ind with initial HEP    Status  Achieved      PT SHORT TERM GOAL #2   Title  reports at least 30% less pain overall    Baseline  30-40% less pain    Status  Achieved        PT Long Term Goals - 06/25/18 1216      PT LONG TERM GOAL #1   Title  ind with advanced HEP    Status  On-going      PT LONG TERM GOAL #2   Title  reports 75% less pain in low back    Status  On-going      PT LONG TERM GOAL #3   Title  able to lift all normal objects at home without icreased pain due to improved core strength    Status  Achieved      PT LONG TERM GOAL #4   Title  pt will be able to sit as long as needed for normal daily activities and recreational activities without increases in pain    Baseline  2.5 hours    Status  On-going            Plan - 07/08/18 1058    Clinical Impression Statement  Pt was able to perform core strengthening exercises with cues to keep pelvis neutral . She reports  feeling better when doing the exercises.  Pt tolerated treatment well and had increase soft tissue length with manual an dry needling techniques.Pt will benefit from skilled PT to continue to progress postural strength and work on improved soft tissue length.    PT Treatment/Interventions  ADLs/Self Care Home Management;Biofeedback;Cryotherapy;Electrical Stimulation;Moist Heat;Traction;Gait training;Therapeutic activities;Therapeutic exercise;Neuromuscular re-education;Patient/family education;Manual techniques;Taping;Dry needling;Passive range of motion    PT Next Visit Plan  f/u on STM/DN to lumbar multifidi, lumbar and hip ROM, posture and continue TrA and core strength    Consulted and Agree with Plan of Care  Patient       Patient will benefit from skilled therapeutic intervention in order to improve the following deficits and impairments:  Increased muscle spasms, Decreased strength, Impaired tone, Decreased range of motion, Impaired flexibility, Increased fascial restricitons, Postural dysfunction, Pain  Visit Diagnosis: Acute midline low back pain without sciatica  Muscle weakness (generalized)  Cramp and spasm     Problem List Patient Active Problem List   Diagnosis Date Noted  . BMI 29.0-29.9,adult 06/04/2017  . MITRAL VALVE PROLAPSE 05/25/2007    Zannie Cove, PT 07/08/2018, 12:57 PM  [facility] [address], [place], [place], [address] Phone: [phone]   Fax:  [phone]  Name: Gloria Reid MRN: [id number] Date of Birth: [birth year]

## 2018-07-12 ENCOUNTER — Encounter: Payer: Self-pay | Admitting: Physical Therapy

## 2018-07-12 ENCOUNTER — Ambulatory Visit: Attending: Family Medicine | Admitting: Physical Therapy

## 2018-07-12 DIAGNOSIS — R252 Cramp and spasm: Secondary | ICD-10-CM | POA: Diagnosis present

## 2018-07-12 DIAGNOSIS — M6281 Muscle weakness (generalized): Secondary | ICD-10-CM

## 2018-07-12 DIAGNOSIS — M545 Low back pain, unspecified: Secondary | ICD-10-CM

## 2018-07-12 NOTE — Therapy (Signed)
[facility] [address], [place], [place], [address] Phone: [phone]   Fax:  [phone]  Physical Therapy Treatment  Patient Details  Name: Gloria Reid MRN: [id number] Date of Birth: [birth year] Referring Provider (PT): Lucretia Kern, DO   Encounter Date: 07/12/2018  PT End of Session - 07/12/18 0807    Visit Number  6    Number of Visits  16    Date for PT Re-Evaluation  08/07/18    Authorization Type  Med pay    PT Start Time  0805    PT Stop Time  0844    PT Time Calculation (min)  39 min    Activity Tolerance  Patient tolerated treatment well    Behavior During Therapy  [facility] for tasks assessed/performed       Past Medical History:  Diagnosis Date  . Breast mass, right    S/P Biopsy 03/2012 - Benign  . Open-angle glaucoma     Past Surgical History:  Procedure Laterality Date  . MASS EXCISION  04/03/2012   Procedure: EXCISION MASS;  Surgeon: Harl Bowie, MD;  Location: [facility];  Service: General;  Laterality: Right;  Excision Right Breast Mass    There were no vitals filed for this visit.  Subjective Assessment - 07/12/18 0856    Subjective  Pt states she felt much better after last session for about a day    Currently in Pain?  Yes    Pain Score  6     Pain Location  Back    Pain Orientation  Right;Left    Pain Descriptors / Indicators  Aching    Pain Type  Chronic pain    Pain Onset  More than a month ago                       [facility] Adult PT Treatment/Exercise - 07/12/18 0001      Lumbar Exercises: Standing   Row  Strengthening;Power tower;Both;20 reps;Limitations    Row Limitations  25 lb    Shoulder Extension  Strengthening;20 reps;Limitations    Shoulder Extension Limitations  20 lb power tower    Other Standing Lumbar Exercises  isometric TrA with diagonals and stepping forward with red resistance band - 20x    Other Standing Lumbar Exercises  wall push up 20x  flat surface; 20x using ball      Lumbar Exercises: Seated   Hip Flexion on Ball  Strengthening;Right;Left;20 reps   added hand for second set of 20 with 1lb weights   Other Seated Lumbar Exercises  roll ball for flexion and side bend      Lumbar Exercises: Supine   Large Ball Abdominal Isometric  20 reps    Large Ball Oblique Isometric  15 reps   red ball roll out and rotation   Other Supine Lumbar Exercises  lying on foam roll rocking for lumbar and glute fascial release               PT Short Term Goals - 06/25/18 1222      PT SHORT TERM GOAL #1   Title  ind with initial HEP    Status  Achieved      PT SHORT TERM GOAL #2   Title  reports at least 30% less pain overall    Baseline  30-40% less pain    Status  Achieved        PT Long Term  Goals - 07/12/18 0809      PT LONG TERM GOAL #1   Title  ind with advanced HEP    Status  On-going      PT LONG TERM GOAL #2   Title  reports 75% less pain in low back    Baseline  50%    Status  Partially Met      PT LONG TERM GOAL #3   Title  able to lift all normal objects at home without icreased pain due to improved core strength    Status  Achieved      PT LONG TERM GOAL #4   Title  pt will be able to sit as long as needed for normal daily activities and recreational activities without increases in pain    Baseline  at least 3 hours, normally sits for 5-6 hours    Status  Partially Met            Plan - 07/12/18 0900    Clinical Impression Statement  Pt demonstrating good progress with overall 50% less pain.  She is sitting for at least 3 hours at a time.  she needs cues during session to  keep shoulder relaxed and lengthen low back.  She felt good doing lumbar flexion stretch at the end of the session. She will continue to benefit from skilled PT to work on improved core strength    PT Treatment/Interventions  ADLs/Self Care Home Management;Biofeedback;Cryotherapy;Electrical Stimulation;Moist  Heat;Traction;Gait training;Therapeutic activities;Therapeutic exercise;Neuromuscular re-education;Patient/family education;Manual techniques;Taping;Dry needling;Passive range of motion    PT Next Visit Plan  DN to lumbar multifidi and TrA and core strengthening    PT Home Exercise Plan   Access Code: [email or web address]    Consulted and Agree with Plan of Care  Patient       Patient will benefit from skilled therapeutic intervention in order to improve the following deficits and impairments:  Increased muscle spasms, Decreased strength, Impaired tone, Decreased range of motion, Impaired flexibility, Increased fascial restricitons, Postural dysfunction, Pain  Visit Diagnosis: Acute midline low back pain without sciatica  Muscle weakness (generalized)  Cramp and spasm     Problem List Patient Active Problem List   Diagnosis Date Noted  . BMI 29.0-29.9,adult 06/04/2017  . MITRAL VALVE PROLAPSE 05/25/2007    Zannie Cove, PT 07/12/2018, 10:11 AM  [facility] [address], [place], [place], [address] Phone: [phone]   Fax:  [phone]  Name: Gloria Reid MRN: [id number] Date of Birth: [birth year]

## 2018-07-15 ENCOUNTER — Telehealth: Payer: Self-pay | Admitting: *Deleted

## 2018-07-15 NOTE — Telephone Encounter (Signed)
Pt called to speak with Denice Paradise and stated that a fax was sent over by PT for the ER paperwork and Elie Confer faxed over the [facility] paperwork In December. / Please call back when available

## 2018-07-15 NOTE — Telephone Encounter (Signed)
Dr Maudie Mercury only received a work note from the ER that the pt dropped off (see note on form for FMLA in media from 12/17 in which patient was to bring work note and FMLA papers). She was awaiting FMLA paperwork to be received for review and completion and as of today this has not been received.  I left a message for the pt to return my call.  CRM also created.

## 2018-07-16 NOTE — Telephone Encounter (Signed)
I called the pt and informed her we have not received any FMLA forms.  She was given our fax number and stated she will call Elie Confer and have these sent to our office.

## 2018-07-17 ENCOUNTER — Ambulatory Visit: Admitting: Physical Therapy

## 2018-07-17 DIAGNOSIS — R252 Cramp and spasm: Secondary | ICD-10-CM

## 2018-07-17 DIAGNOSIS — M545 Low back pain, unspecified: Secondary | ICD-10-CM

## 2018-07-17 DIAGNOSIS — M6281 Muscle weakness (generalized): Secondary | ICD-10-CM

## 2018-07-17 NOTE — Therapy (Signed)
[facility] [address] [place], [place], [address] Phone: [phone]   Fax:  [phone]  Physical Therapy Treatment  Patient Details  Name: Gloria Reid MRN: [id number] Date of Birth: 01/27/1961 Referring Provider (PT): Lucretia Kern, DO   Encounter Date: 07/17/2018  PT End of Session - 07/17/18 0855    Visit Number  7    Number of Visits  16    Date for PT Re-Evaluation  08/07/18    Authorization Type  Med pay    PT Start Time  [phone]   pt arrived late   PT Stop Time  0930    PT Time Calculation (min)  38 min    Activity Tolerance  Patient tolerated treatment well    Behavior During Therapy  [facility] for tasks assessed/performed       Past Medical History:  Diagnosis Date  . Breast mass, right    S/P Biopsy 03/2012 - Benign  . Open-angle glaucoma     Past Surgical History:  Procedure Laterality Date  . MASS EXCISION  04/03/2012   Procedure: EXCISION MASS;  Surgeon: Harl Bowie, MD;  Location: [facility];  Service: General;  Laterality: Right;  Excision Right Breast Mass    There were no vitals filed for this visit.  Subjective Assessment - 07/17/18 0904    Subjective  Pt states she is having some pain today.  Pt states she is not doing any heavy lifting at home.    Limitations  Sitting    Currently in Pain?  Yes    Pain Score  6     Pain Location  Back    Pain Orientation  Mid    Pain Descriptors / Indicators  Aching    Pain Type  Chronic pain    Pain Onset  More than a month ago    Pain Frequency  Constant    Aggravating Factors   sitting    Pain Relieving Factors  relaxing at home, walking and not sitting for long periods of time    Multiple Pain Sites  No                       [facility] Adult PT Treatment/Exercise - 07/17/18 0001      Lumbar Exercises: Stretches   Active Hamstring Stretch  Right;Left;3 reps;30 seconds   standing   Single Knee to Chest Stretch  Right;Left;3  reps;20 seconds      Lumbar Exercises: Aerobic   UBE (Upper Arm Bike)  L3 3x3 - standing   PT present to discuss progress     Lumbar Exercises: Standing   Row  Strengthening;Power tower;Both;20 reps;Limitations    Row Limitations  25lb    Shoulder Extension  Strengthening;20 reps;Limitations    Shoulder Extension Limitations  20 lb power tower      Lumbar Exercises: Seated   Sit to Stand  20 reps   with TrA engage     Manual Therapy   Manual Therapy  Soft tissue mobilization    Manual therapy comments  lumbar and lower thoracic paraspinals, gluteal attachments       Trigger Point Dry Needling - 07/17/18 0910    Consent Given?  Yes    Muscles Treated Lower Body  --   lumbar multifidi            PT Short Term Goals - 06/25/18 1222      PT SHORT TERM GOAL #1  Title  ind with initial HEP    Status  Achieved      PT SHORT TERM GOAL #2   Title  reports at least 30% less pain overall    Baseline  30-40% less pain    Status  Achieved        PT Long Term Goals - 07/12/18 0809      PT LONG TERM GOAL #1   Title  ind with advanced HEP    Status  On-going      PT LONG TERM GOAL #2   Title  reports 75% less pain in low back    Baseline  50%    Status  Partially Met      PT LONG TERM GOAL #3   Title  able to lift all normal objects at home without icreased pain due to improved core strength    Status  Achieved      PT LONG TERM GOAL #4   Title  pt will be able to sit as long as needed for normal daily activities and recreational activities without increases in pain    Baseline  at least 3 hours, normally sits for 5-6 hours    Status  Partially Met            Plan - 07/17/18 1056    Clinical Impression Statement  Pt had good response from treatment with decreased pain after DN #3 and manual techniques.  Pt had palpable increased muscle length.  She responded well to lumbar flexion stretches.  Pt will benefit from skilled PT to continue to progress core  strength and improved muscle length.    PT Treatment/Interventions  ADLs/Self Care Home Management;Biofeedback;Cryotherapy;Electrical Stimulation;Moist Heat;Traction;Gait training;Therapeutic activities;Therapeutic exercise;Neuromuscular re-education;Patient/family education;Manual techniques;Taping;Dry needling;Passive range of motion    PT Next Visit Plan  f/u on DN #3 to lumbar multifidi and TrA and core strengthening    PT Home Exercise Plan   Access Code: [email or web address]    Recommended Other Services  eval 71/2 cert signed    Consulted and Agree with Plan of Care  Patient       Patient will benefit from skilled therapeutic intervention in order to improve the following deficits and impairments:  Increased muscle spasms, Decreased strength, Impaired tone, Decreased range of motion, Impaired flexibility, Increased fascial restricitons, Postural dysfunction, Pain  Visit Diagnosis: Acute midline low back pain without sciatica  Muscle weakness (generalized)  Cramp and spasm     Problem List Patient Active Problem List   Diagnosis Date Noted  . BMI 29.0-29.9,adult 06/04/2017  . MITRAL VALVE PROLAPSE 05/25/2007    Zannie Cove, PT 07/17/2018, 10:59 AM  [facility] [address], [place], [place], [address] Phone: [phone]   Fax:  [phone]  Name: Gloria Reid MRN: [id number] Date of Birth: Oct 15, 1960
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
## 2018-07-18 ENCOUNTER — Ambulatory Visit (INDEPENDENT_AMBULATORY_CARE_PROVIDER_SITE_OTHER): Admitting: Family Medicine

## 2018-07-18 ENCOUNTER — Encounter: Payer: Self-pay | Admitting: Family Medicine

## 2018-07-18 VITALS — BP 100/76 | HR 79 | Temp 97.9°F | Ht 67.0 in | Wt 179.2 lb

## 2018-07-18 DIAGNOSIS — M545 Low back pain, unspecified: Secondary | ICD-10-CM

## 2018-07-18 NOTE — Patient Instructions (Addendum)
Follow up yearly for physical.  I am so glad you are doing better!!!!  Please complete the therapy as planned and follow back up with [place] promptly if you have any more issues with your back.

## 2018-07-19 ENCOUNTER — Ambulatory Visit: Admitting: Physical Therapy

## 2018-07-19 ENCOUNTER — Encounter: Payer: Self-pay | Admitting: Physical Therapy

## 2018-07-19 DIAGNOSIS — Z0279 Encounter for issue of other medical certificate: Secondary | ICD-10-CM

## 2018-07-19 DIAGNOSIS — M545 Low back pain, unspecified: Secondary | ICD-10-CM

## 2018-07-19 DIAGNOSIS — M6281 Muscle weakness (generalized): Secondary | ICD-10-CM

## 2018-07-19 NOTE — Therapy (Signed)
[facility] [address] [place], [place], [address] Phone: [phone]   Fax:  [phone]  Physical Therapy Treatment  Patient Details  Name: Gloria Reid MRN: [id number] Date of Birth: Dec 22, 1960 Referring Provider (PT): Lucretia Kern, DO   Encounter Date: 07/19/2018  PT End of Session - 07/19/18 0803    Visit Number  8    Number of Visits  16    Date for PT Re-Evaluation  08/07/18    Authorization Type  Med pay    PT Start Time  0801    PT Stop Time  0844    PT Time Calculation (min)  43 min    Activity Tolerance  Patient tolerated treatment well    Behavior During Therapy  [facility] for tasks assessed/performed       Past Medical History:  Diagnosis Date  . Breast mass, right    S/P Biopsy 03/2012 - Benign  . Open-angle glaucoma     Past Surgical History:  Procedure Laterality Date  . MASS EXCISION  04/03/2012   Procedure: EXCISION MASS;  Surgeon: Harl Bowie, MD;  Location: [facility];  Service: General;  Laterality: Right;  Excision Right Breast Mass    There were no vitals filed for this visit.  Subjective Assessment - 07/19/18 0804    Subjective  Pt just got off work.  She noticed back sorenss onset at 3am while on night shift.  Purposely avoids sitting for long periods.    Limitations  Sitting    How long can you sit comfortably?  can sit 2.5 hours    Patient Stated Goals  get rid of pain    Currently in Pain?  Yes    Pain Score  6     Pain Location  Back    Pain Orientation  Mid    Pain Descriptors / Indicators  Aching    Pain Type  Chronic pain    Pain Onset  More than a month ago    Pain Frequency  Intermittent    Aggravating Factors   sitting                       [facility] Adult PT Treatment/Exercise - 07/19/18 0001      Exercises   Exercises  Lumbar;Knee/Hip      Lumbar Exercises: Stretches   Active Hamstring Stretch  Right;Left;30 seconds;3 reps    Active  Hamstring Stretch Limitations  standing    Single Knee to Chest Stretch  Right;Left;3 reps;20 seconds    Double Knee to Chest Stretch  3 reps;20 seconds    Double Knee to Chest Stretch Limitations  firm foam roller under pelvis to increase lumbar lordosis reversal      Lumbar Exercises: Standing   Row  Strengthening;Both;20 reps    Row Limitations  25lb    Shoulder Extension  Strengthening;Power Tower;Both;20 reps    Shoulder Extension Limitations  20lb    Other Standing Lumbar Exercises  sidestepping with transverse red tband 5x Rt, 5x Lt    Other Standing Lumbar Exercises  marching on foam pad with mirror for feedback, cueing PF and TrA      Lumbar Exercises: Seated   Sit to Stand  20 reps    Sit to Stand Limitations  use of dowel and cueing to keep COG forward      Lumbar Exercises: Supine   Large Ball Abdominal Isometric  10 reps;5 seconds  Large Ball Abdominal Isometric Limitations  PT cued pelvic tilt    Large Ball Oblique Isometric  5 reps;5 seconds    Large Ball Oblique Isometric Limitations  both directions               PT Short Term Goals - 06/25/18 1222      PT SHORT TERM GOAL #1   Title  ind with initial HEP    Status  Achieved      PT SHORT TERM GOAL #2   Title  reports at least 30% less pain overall    Baseline  30-40% less pain    Status  Achieved        PT Long Term Goals - 07/19/18 0807      PT LONG TERM GOAL #1   Title  ind with advanced HEP    Time  8    Period  Weeks    Status  On-going      PT LONG TERM GOAL #2   Title  reports 75% less pain in low back    Baseline  50%    Time  8    Period  Weeks    Status  On-going      PT LONG TERM GOAL #3   Title  able to lift all normal objects at home without icreased pain due to improved core strength    Status  Achieved      PT LONG TERM GOAL #4   Title  pt will be able to sit as long as needed for normal daily activities and recreational activities without increases in pain     Baseline  at least 3 hours, normally sits for 5-6 hours    Status  On-going            Plan - 07/19/18 0847    Clinical Impression Statement  Pt reported 6/10 pain having just worked a night shift.  PT focused on lumbar decompression stretches and hamstring stretches followed by body mechanics and stabilization strategies during standing posture, sit to stand/squatting.  Dowel and mirror were used for feedback along with verbal cues and demo by PT for keeping COG forward during squats and encouraging use of core vs gluteals for standing stability.  Pt reported improved back pain and pressure end of session.  She will continue to benefit from skilled PT to address pain, body mechanics, core stabilization, flexibility and strength along POC.    Rehab Potential  Excellent    PT Frequency  2x / week    PT Duration  8 weeks    PT Treatment/Interventions  ADLs/Self Care Home Management;Biofeedback;Cryotherapy;Electrical Stimulation;Moist Heat;Traction;Gait training;Therapeutic activities;Therapeutic exercise;Neuromuscular re-education;Patient/family education;Manual techniques;Taping;Dry needling;Passive range of motion    PT Next Visit Plan  continue DN as needed, continue core stabilization and incorporation of core into funtional movement patterns    PT Home Exercise Plan   Access Code: [email or web address]    Consulted and Agree with Plan of Care  Patient       Patient will benefit from skilled therapeutic intervention in order to improve the following deficits and impairments:  Increased muscle spasms, Decreased strength, Impaired tone, Decreased range of motion, Impaired flexibility, Increased fascial restricitons, Postural dysfunction, Pain  Visit Diagnosis: Acute midline low back pain without sciatica  Muscle weakness (generalized)     Problem List Patient Active Problem List   Diagnosis Date Noted  . BMI 29.0-29.9,adult 06/04/2017  . MITRAL VALVE PROLAPSE 05/25/2007    Baruch Merl,  PT  07/19/18 8:51 AM   [facility] [address], [place], [place], [address] Phone: [phone]   Fax:  [phone]  Name: Gloria Reid MRN: [id number] Date of Birth: 1960/09/26

## 2018-07-24 ENCOUNTER — Telehealth: Payer: Self-pay | Admitting: Family Medicine

## 2018-07-24 ENCOUNTER — Ambulatory Visit: Admitting: Physical Therapy

## 2018-07-24 ENCOUNTER — Encounter: Payer: Self-pay | Admitting: Physical Therapy

## 2018-07-24 DIAGNOSIS — M545 Low back pain, unspecified: Secondary | ICD-10-CM

## 2018-07-24 DIAGNOSIS — R252 Cramp and spasm: Secondary | ICD-10-CM

## 2018-07-24 DIAGNOSIS — M6281 Muscle weakness (generalized): Secondary | ICD-10-CM

## 2018-07-24 NOTE — Therapy (Signed)
[facility] [address] [place], [place], [address] Phone: [phone]   Fax:  [phone]  Physical Therapy Treatment  Patient Details  Name: Gloria Reid MRN: [id number] Date of Birth: 06/16/1961 Referring Provider (PT): Lucretia Kern, DO   Encounter Date: 07/24/2018  PT End of Session - 07/24/18 0857    Visit Number  9    Number of Visits  16    Date for PT Re-Evaluation  08/07/18    Authorization Type  Med pay    PT Start Time  0851    PT Stop Time  0929    PT Time Calculation (min)  38 min    Activity Tolerance  Patient tolerated treatment well    Behavior During Therapy  [facility] for tasks assessed/performed       Past Medical History:  Diagnosis Date  . Breast mass, right    S/P Biopsy 03/2012 - Benign  . Open-angle glaucoma     Past Surgical History:  Procedure Laterality Date  . MASS EXCISION  04/03/2012   Procedure: EXCISION MASS;  Surgeon: Harl Bowie, MD;  Location: [facility];  Service: General;  Laterality: Right;  Excision Right Breast Mass    There were no vitals filed for this visit.  Subjective Assessment - 07/24/18 0854    Subjective  Pt states she is not having as much pain today    Currently in Pain?  Yes    Pain Score  5     Pain Location  Back    Pain Orientation  Mid    Pain Descriptors / Indicators  Aching    Pain Type  Chronic pain    Pain Onset  More than a month ago    Multiple Pain Sites  No                       [facility] Adult PT Treatment/Exercise - 07/24/18 0001      Lumbar Exercises: Stretches   Active Hamstring Stretch  Right;Left;30 seconds;3 reps    Single Knee to Chest Stretch  Right;Left;3 reps;20 seconds    Double Knee to Chest Stretch  3 reps;20 seconds    Other Lumbar Stretch Exercise  child's pose 3 ways - 3 x 20 each way      Lumbar Exercises: Aerobic   UBE (Upper Arm Bike)  L3 3x3 fwd/back   PT present to discuss progress     Lumbar Exercises: Supine   Pelvic Tilt  5 reps    Bent Knee Raise  15 reps    Other Supine Lumbar Exercises  bilat LE toe tapping - 10x each      Moist Heat Therapy   Number Minutes Moist Heat  10 Minutes   during stretches   Moist Heat Location  Lumbar Spine      Manual Therapy   Manual therapy comments  lumbar and lower thoracic paraspinals, gluteal attachments       Trigger Point Dry Needling - 07/24/18 0858    Consent Given?  Yes    Muscles Treated Lower Body  --   lumbar multifidi            PT Short Term Goals - 06/25/18 1222      PT SHORT TERM GOAL #1   Title  ind with initial HEP    Status  Achieved      PT SHORT TERM GOAL #2   Title  reports at  least 30% less pain overall    Baseline  30-40% less pain    Status  Achieved        PT Long Term Goals - 07/19/18 0807      PT LONG TERM GOAL #1   Title  ind with advanced HEP    Time  8    Period  Weeks    Status  On-going      PT LONG TERM GOAL #2   Title  reports 75% less pain in low back    Baseline  50%    Time  8    Period  Weeks    Status  On-going      PT LONG TERM GOAL #3   Title  able to lift all normal objects at home without icreased pain due to improved core strength    Status  Achieved      PT LONG TERM GOAL #4   Title  pt will be able to sit as long as needed for normal daily activities and recreational activities without increases in pain    Baseline  at least 3 hours, normally sits for 5-6 hours    Status  On-going            Plan - 07/24/18 0930    Clinical Impression Statement  Pt is doing better.  She reports effects of dry needling last a little longer each time.  She had muscle spasms in Rt>Lt lumbar region that got some release with dry needling to lumbar multifidi and then STM to lumbar QL and paraspinals bilaterally. Pt responded well to stretches and heat applied during stretches.  Pt was able to do more advanced core strengthening but fatigued quickly and needed  frequent rest breaks with toe tapping.  She is recommended to continue skilled PT for strength and reduced muscle spasms.    PT Treatment/Interventions  ADLs/Self Care Home Management;Biofeedback;Cryotherapy;Electrical Stimulation;Moist Heat;Traction;Gait training;Therapeutic activities;Therapeutic exercise;Neuromuscular re-education;Patient/family education;Manual techniques;Taping;Dry needling;Passive range of motion    PT Next Visit Plan  continue DN as needed, continue core stabilization and incorporation of core into funtional movement patterns    PT Home Exercise Plan   Access Code: [email or web address]    Consulted and Agree with Plan of Care  Patient       Patient will benefit from skilled therapeutic intervention in order to improve the following deficits and impairments:  Increased muscle spasms, Decreased strength, Impaired tone, Decreased range of motion, Impaired flexibility, Increased fascial restricitons, Postural dysfunction, Pain  Visit Diagnosis: Acute midline low back pain without sciatica  Muscle weakness (generalized)  Cramp and spasm     Problem List Patient Active Problem List   Diagnosis Date Noted  . BMI 29.0-29.9,adult 06/04/2017  . MITRAL VALVE PROLAPSE 05/25/2007    Gloria Reid, PT 07/24/2018, 9:34 AM  [facility] [address] [place], [place], [address] Phone: [phone]   Fax:  [phone]  Name: Gloria Reid MRN: [id number] Date of Birth: 07/02/1961

## 2018-07-24 NOTE — Telephone Encounter (Signed)
FMLA forms to be filled out.  Placed in dr's folder.  Patient wants forms faxed and then call her as well when they are complete.

## 2018-07-25 NOTE — Telephone Encounter (Signed)
Completed prior. Put in your box this morning. Thanks.

## 2018-07-25 NOTE — Telephone Encounter (Signed)
I called the pt and informed her the forms were completed and faxed to [employer].  Copy sent to be scanned and placed in the folder to be placed at the front desk for pick up.  Patient is also aware there is a charge for this.

## 2018-07-26 ENCOUNTER — Encounter: Payer: Self-pay | Admitting: Physical Therapy

## 2018-07-26 ENCOUNTER — Ambulatory Visit: Admitting: Physical Therapy

## 2018-07-26 DIAGNOSIS — M6281 Muscle weakness (generalized): Secondary | ICD-10-CM

## 2018-07-26 DIAGNOSIS — M545 Low back pain, unspecified: Secondary | ICD-10-CM

## 2018-07-26 DIAGNOSIS — R252 Cramp and spasm: Secondary | ICD-10-CM

## 2018-07-26 NOTE — Therapy (Signed)
[facility] [address] [place], [place], [address] Phone: [phone]   Fax:  [phone]  Physical Therapy Treatment  Patient Details  Name: Gloria Reid MRN: [id number] Date of Birth: [birth year] Referring Provider (PT): Lucretia Kern, DO   Encounter Date: 07/26/2018  PT End of Session - 07/26/18 0855    Visit Number  10    Number of Visits  16    Date for PT Re-Evaluation  08/07/18    Authorization Type  Med pay    PT Start Time  0849    PT Stop Time  0929    PT Time Calculation (min)  40 min    Activity Tolerance  Patient tolerated treatment well    Behavior During Therapy  [facility] for tasks assessed/performed       Past Medical History:  Diagnosis Date  . Breast mass, right    S/P Biopsy 03/2012 - Benign  . Open-angle glaucoma     Past Surgical History:  Procedure Laterality Date  . MASS EXCISION  04/03/2012   Procedure: EXCISION MASS;  Surgeon: Harl Bowie, MD;  Location: [facility];  Service: General;  Laterality: Right;  Excision Right Breast Mass    There were no vitals filed for this visit.  Subjective Assessment - 07/26/18 0852    Subjective  Pt states she feels the same as last time which is better than before    Limitations  Sitting    Patient Stated Goals  get rid of pain    Currently in Pain?  Yes    Pain Score  6     Pain Location  Back    Pain Descriptors / Indicators  Aching    Pain Type  Chronic pain    Pain Onset  More than a month ago    Pain Frequency  Intermittent                       [facility] Adult PT Treatment/Exercise - 07/26/18 0001      Lumbar Exercises: Stretches   Active Hamstring Stretch  Right;Left;30 seconds;3 reps    Single Knee to Chest Stretch  Right;Left;3 reps;20 seconds    Double Knee to Chest Stretch  3 reps;20 seconds      Lumbar Exercises: Aerobic   UBE (Upper Arm Bike)  L3 3x3 fwd/back   PT present to discuss progress     Lumbar  Exercises: Supine   Pelvic Tilt  5 reps    Bent Knee Raise  15 reps    Straight Leg Raise  5 reps    Straight Leg Raises Limitations  small circles both ways alternating directions    Large Ball Abdominal Isometric  10 reps;5 seconds      Lumbar Exercises: Sidelying   Hip Abduction  Right;Left;5 reps    Hip Abduction Limitations  small circles both ways      Knee/Hip Exercises: Standing   SLS with Vectors  on slider - 10x each way    Walking with Sports Cord  25# fwd/back; 20# each side - 8 reps each   cues to brace core              PT Short Term Goals - 06/25/18 1222      PT SHORT TERM GOAL #1   Title  ind with initial HEP    Status  Achieved      PT SHORT TERM GOAL #2  Title  reports at least 30% less pain overall    Baseline  30-40% less pain    Status  Achieved        PT Long Term Goals - 07/19/18 0807      PT LONG TERM GOAL #1   Title  ind with advanced HEP    Time  8    Period  Weeks    Status  On-going      PT LONG TERM GOAL #2   Title  reports 75% less pain in low back    Baseline  50%    Time  8    Period  Weeks    Status  On-going      PT LONG TERM GOAL #3   Title  able to lift all normal objects at home without icreased pain due to improved core strength    Status  Achieved      PT LONG TERM GOAL #4   Title  pt will be able to sit as long as needed for normal daily activities and recreational activities without increases in pain    Baseline  at least 3 hours, normally sits for 5-6 hours    Status  On-going            Plan - 07/26/18 0859    Clinical Impression Statement  Pt states she is still feeling better.  She needed cues to keep slight pelvic tilt and some tactile cues in sidelying to keep hips stacked and engage TrA.  Pt was able to perform variety of core strengthening while doing functional movements.  Pt will benefit from skilled PT to continue working on functional movements with core strength for maximum return to  functional activities without pain.    PT Treatment/Interventions  ADLs/Self Care Home Management;Biofeedback;Cryotherapy;Electrical Stimulation;Moist Heat;Traction;Gait training;Therapeutic activities;Therapeutic exercise;Neuromuscular re-education;Patient/family education;Manual techniques;Taping;Dry needling;Passive range of motion    PT Next Visit Plan  continue DN as needed, continue core stabilization and incorporation of core into funtional movement patterns    PT Home Exercise Plan   Access Code: [email or web address]    Consulted and Agree with Plan of Care  Patient       Patient will benefit from skilled therapeutic intervention in order to improve the following deficits and impairments:  Increased muscle spasms, Decreased strength, Impaired tone, Decreased range of motion, Impaired flexibility, Increased fascial restricitons, Postural dysfunction, Pain  Visit Diagnosis: Acute midline low back pain without sciatica  Muscle weakness (generalized)  Cramp and spasm     Problem List Patient Active Problem List   Diagnosis Date Noted  . BMI 29.0-29.9,adult 06/04/2017  . MITRAL VALVE PROLAPSE 05/25/2007    Zannie Cove, PT 07/26/2018, 10:17 AM  [facility] [address] [place], [place], [address] Phone: [phone]   Fax:  [phone]  Name: Gloria Reid MRN: [id number] Date of Birth: 1961-03-05

## 2018-07-30 ENCOUNTER — Ambulatory Visit: Admitting: Physical Therapy

## 2018-07-30 ENCOUNTER — Encounter: Payer: Self-pay | Admitting: Physical Therapy

## 2018-07-30 DIAGNOSIS — R252 Cramp and spasm: Secondary | ICD-10-CM

## 2018-07-30 DIAGNOSIS — M545 Low back pain, unspecified: Secondary | ICD-10-CM

## 2018-07-30 DIAGNOSIS — M6281 Muscle weakness (generalized): Secondary | ICD-10-CM

## 2018-07-30 NOTE — Therapy (Signed)
[facility] [address] [place], [place], [address] Phone: [phone]   Fax:  [phone]  Physical Therapy Treatment  Patient Details  Name: Gloria Reid MRN: [id number] Date of Birth: Mar 12, 1961 Referring Provider (PT): Lucretia Kern, DO   Encounter Date: 07/30/2018  PT End of Session - 07/30/18 0953    Visit Number  11    Number of Visits  16    Date for PT Re-Evaluation  08/07/18    Authorization Type  Med pay    PT Start Time  0846    PT Stop Time  0925    PT Time Calculation (min)  39 min    Activity Tolerance  Patient tolerated treatment well    Behavior During Therapy  [facility] for tasks assessed/performed       Past Medical History:  Diagnosis Date  . Breast mass, right    S/P Biopsy 03/2012 - Benign  . Open-angle glaucoma     Past Surgical History:  Procedure Laterality Date  . MASS EXCISION  04/03/2012   Procedure: EXCISION MASS;  Surgeon: Harl Bowie, MD;  Location: [facility];  Service: General;  Laterality: Right;  Excision Right Breast Mass    There were no vitals filed for this visit.  Subjective Assessment - 07/30/18 0951    Subjective  My back was really sore on Sunday but I didn't sleep for 24 hours before that.  Today it is not bad    Patient Stated Goals  get rid of pain    Currently in Pain?  Yes    Pain Score  5     Pain Location  Back    Pain Orientation  Mid    Pain Descriptors / Indicators  Aching    Pain Onset  More than a month ago    Multiple Pain Sites  No                       [facility] Adult PT Treatment/Exercise - 07/30/18 0001      Lumbar Exercises: Aerobic   UBE (Upper Arm Bike)  L3 3x3 fwd/back   PT present to discuss progress     Lumbar Exercises: Supine   Other Supine Lumbar Exercises  lying on foam roll rocking for lumbar and glute fascial release    Other Supine Lumbar Exercises  lying on foam roll verticle - march with red band; hip abd  red band, pball overhead, horizontal abd green band;       Knee/Hip Exercises: Standing   SLS with Vectors  on slider fwd mini squats 10x each side with moderate cues to keep knee behind toes and hinge at hip - abdominals engaged    Walking with Sports Cord  25# fwd/back; 20# each side - 8 reps each   cues to brace core              PT Short Term Goals - 06/25/18 1222      PT SHORT TERM GOAL #1   Title  ind with initial HEP    Status  Achieved      PT SHORT TERM GOAL #2   Title  reports at least 30% less pain overall    Baseline  30-40% less pain    Status  Achieved        PT Long Term Goals - 07/19/18 0807      PT LONG TERM GOAL #1   Title  ind with advanced HEP    Time  8    Period  Weeks    Status  On-going      PT LONG TERM GOAL #2   Title  reports 75% less pain in low back    Baseline  50%    Time  8    Period  Weeks    Status  On-going      PT LONG TERM GOAL #3   Title  able to lift all normal objects at home without icreased pain due to improved core strength    Status  Achieved      PT LONG TERM GOAL #4   Title  pt will be able to sit as long as needed for normal daily activities and recreational activities without increases in pain    Baseline  at least 3 hours, normally sits for 5-6 hours    Status  On-going            Plan - 07/30/18 0954    Clinical Impression Statement  Pt did well with exercises today.  She has been staying consitently at lower pain level most of the time.  Pt continues to need cues during exercises.  She did well with exercises on foam roller using foam to cue her to keep core engaged and press low back into roller.  Pt will benefit from skilled PT to continue working on core strength and ROM for maximum functional with reduced pain.    PT Treatment/Interventions  ADLs/Self Care Home Management;Biofeedback;Cryotherapy;Electrical Stimulation;Moist Heat;Traction;Gait training;Therapeutic activities;Therapeutic  exercise;Neuromuscular re-education;Patient/family education;Manual techniques;Taping;Dry needling;Passive range of motion    PT Next Visit Plan  continue DN as needed, continue core stabilization and incorporation of core into funtional movement patterns    PT Home Exercise Plan   Access Code: [email or web address]    Consulted and Agree with Plan of Care  Patient       Patient will benefit from skilled therapeutic intervention in order to improve the following deficits and impairments:  Increased muscle spasms, Decreased strength, Impaired tone, Decreased range of motion, Impaired flexibility, Increased fascial restricitons, Postural dysfunction, Pain  Visit Diagnosis: Acute midline low back pain without sciatica  Muscle weakness (generalized)  Cramp and spasm     Problem List Patient Active Problem List   Diagnosis Date Noted  . BMI 29.0-29.9,adult 06/04/2017  . MITRAL VALVE PROLAPSE 05/25/2007    Zannie Cove, PT 07/30/2018, 9:56 AM  [facility] [address] [place], [place], [address] Phone: [phone]   Fax:  [phone]  Name: SAREENA ODEH MRN: [id number] Date of Birth: 08-May-1961

## 2018-08-01 ENCOUNTER — Ambulatory Visit: Admitting: Physical Therapy

## 2018-08-01 DIAGNOSIS — M6281 Muscle weakness (generalized): Secondary | ICD-10-CM

## 2018-08-01 DIAGNOSIS — M545 Low back pain, unspecified: Secondary | ICD-10-CM

## 2018-08-01 DIAGNOSIS — R252 Cramp and spasm: Secondary | ICD-10-CM

## 2018-08-01 NOTE — Therapy (Addendum)
[facility] [address] [place], [place], [address] Phone: [phone]   Fax:  [phone]  Physical Therapy Treatment  Patient Details  Name: Gloria Reid MRN: [id number] Date of Birth: [birth year] Referring Provider (PT): Lucretia Kern, DO   Encounter Date: 08/01/2018  PT End of Session - 08/01/18 0806    Visit Number  12    Date for PT Re-Evaluation  08/07/18    Authorization Type  Med pay    PT Start Time  0803    PT Stop Time  0843    PT Time Calculation (min)  40 min    Activity Tolerance  Patient tolerated treatment well    Behavior During Therapy  [facility] for tasks assessed/performed       Past Medical History:  Diagnosis Date  . Breast mass, right    S/P Biopsy 03/2012 - Benign  . Open-angle glaucoma     Past Surgical History:  Procedure Laterality Date  . MASS EXCISION  04/03/2012   Procedure: EXCISION MASS;  Surgeon: Harl Bowie, MD;  Location: [facility];  Service: General;  Laterality: Right;  Excision Right Breast Mass    There were no vitals filed for this visit.  Subjective Assessment - 08/01/18 0807    Subjective  My back is still about the same, better overall.  It was more pain from 12:30 to 4am during my shift and I'm not sure why.    Limitations  Sitting    Patient Stated Goals  get rid of pain    Currently in Pain?  Yes    Pain Score  5     Pain Location  Back    Pain Descriptors / Indicators  Aching    Pain Type  Chronic pain    Pain Onset  More than a month ago    Pain Frequency  Intermittent    Aggravating Factors   sitting and sometimes not sure    Multiple Pain Sites  No                       [facility] Adult PT Treatment/Exercise - 08/01/18 0001      Lumbar Exercises: Aerobic   UBE (Upper Arm Bike)  L3 3x3 fwd/back   PT present to discuss progress     Lumbar Exercises: Seated   Sit to Stand  20 reps   with green band for UE horizontal abduction when  stand   Other Seated Lumbar Exercises  roll out for flexion and lumbar side bend stretch      Lumbar Exercises: Supine   Other Supine Lumbar Exercises  lying on foam roll rocking for lumbar and glute fascial release; alternating single leg extension    Other Supine Lumbar Exercises  trunk rotation on foam roll      Knee/Hip Exercises: Machines for Strengthening   Cybex Leg Press  bilat 70lb x 20; single leg 30lb 20x each   cued not to lock out knee and slowly back     Knee/Hip Exercises: Standing   Lateral Step Up  Right;Left;Step Height: 6";Hand Hold: 0;2 sets;10 reps   pelvic neutral to prevent excess lordosis   Step Down  Right;Left;2 sets;10 reps;Step Height: 6"   step down to foam mat   Walking with Sports Cord  25# fwd/back, each side - 8 reps each   cues to brace core     trunk rotation and piriformis stretch supine - 10  sec x 5 Standing h/s stretch - 30 sec x 2         PT Short Term Goals - 06/25/18 1222      PT SHORT TERM GOAL #1   Title  ind with initial HEP    Status  Achieved      PT SHORT TERM GOAL #2   Title  reports at least 30% less pain overall    Baseline  30-40% less pain    Status  Achieved        PT Long Term Goals - 07/19/18 0807      PT LONG TERM GOAL #1   Title  ind with advanced HEP    Time  8    Period  Weeks    Status  On-going      PT LONG TERM GOAL #2   Title  reports 75% less pain in low back    Baseline  50%    Time  8    Period  Weeks    Status  On-going      PT LONG TERM GOAL #3   Title  able to lift all normal objects at home without icreased pain due to improved core strength    Status  Achieved      PT LONG TERM GOAL #4   Title  pt will be able to sit as long as needed for normal daily activities and recreational activities without increases in pain    Baseline  at least 3 hours, normally sits for 5-6 hours    Status  On-going            Plan - 08/01/18 0816    Clinical Impression Statement  Pt was able to  add some resistance with resisted walking.  She is able to demonstrate imporved ability to maintain neutral pelvic tilt with step downs with some cues.  There was less senstation of using muscle in low back when doing small pelvic tilt.  Pt will benefit from skilled PT to continue to work on improved strength and mechanics during functional activities.    PT Treatment/Interventions  ADLs/Self Care Home Management;Biofeedback;Cryotherapy;Electrical Stimulation;Moist Heat;Traction;Gait training;Therapeutic activities;Therapeutic exercise;Neuromuscular re-education;Patient/family education;Manual techniques;Taping;Dry needling;Passive range of motion    PT Next Visit Plan  continue core stabilization and incorporation of core into funtional movement patterns, half kneeling and anti-rotation resisted    PT Home Exercise Plan   Access Code: [email or web address]    Consulted and Agree with Plan of Care  Patient       Patient will benefit from skilled therapeutic intervention in order to improve the following deficits and impairments:  Increased muscle spasms, Decreased strength, Impaired tone, Decreased range of motion, Impaired flexibility, Increased fascial restricitons, Postural dysfunction, Pain  Visit Diagnosis: Acute midline low back pain without sciatica  Muscle weakness (generalized)  Cramp and spasm     Problem List Patient Active Problem List   Diagnosis Date Noted  . BMI 29.0-29.9,adult 06/04/2017  . MITRAL VALVE PROLAPSE 05/25/2007    Zannie Cove, PT 08/01/2018, 10:16 AM  [facility] [address], [place], [place], [address] Phone: [phone]   Fax:  [phone]  Name: Gloria Reid MRN: [id number] Date of Birth: 29-Apr-1961  PHYSICAL THERAPY DISCHARGE SUMMARY  Visits from Start of Care: 12  Current functional level related to goals / functional outcomes: See above goals   Remaining deficits: See above    Education / Equipment: HEP  Plan: Patient agrees to  discharge.  Patient goals were partially met. Patient is being discharged due to not returning since the last visit.  ?????    [employer], PT 08/26/18 8:25 AM

## 2019-03-24 ENCOUNTER — Encounter: Admitting: Family Medicine

## 2019-05-12 ENCOUNTER — Telehealth: Payer: Self-pay

## 2019-05-12 DIAGNOSIS — R0989 Other specified symptoms and signs involving the circulatory and respiratory systems: Secondary | ICD-10-CM

## 2019-05-12 NOTE — Telephone Encounter (Signed)
Copied from [place] [phone]. Topic: Referral - Request for Referral >> May 12, 2019  8:43 AM Reyne Dumas L wrote: Has patient seen PCP for this complaint? yes *If NO, is insurance requiring patient see PCP for this issue before PCP can refer them? Referral for which specialty: allergist Preferred provider/office: [facility] [address]; [place], [place] Reason for referral: problems with sinus - sneezing/runny nose/watery eyes

## 2019-05-12 NOTE — Telephone Encounter (Signed)
I left a detailed message at the pts home number stating the referral was placed as below and someone will call with appt info.

## 2019-05-12 NOTE — Telephone Encounter (Signed)
Ok for referral?

## 2019-06-03 ENCOUNTER — Encounter: Payer: Self-pay | Admitting: Allergy & Immunology

## 2019-06-03 ENCOUNTER — Other Ambulatory Visit: Payer: Self-pay

## 2019-06-03 ENCOUNTER — Ambulatory Visit (INDEPENDENT_AMBULATORY_CARE_PROVIDER_SITE_OTHER): Admitting: Allergy & Immunology

## 2019-06-03 VITALS — BP 100/76 | HR 89 | Temp 97.6°F | Resp 16 | Ht 65.0 in | Wt 187.8 lb

## 2019-06-03 DIAGNOSIS — J3089 Other allergic rhinitis: Secondary | ICD-10-CM | POA: Diagnosis not present

## 2019-06-03 DIAGNOSIS — J302 Other seasonal allergic rhinitis: Secondary | ICD-10-CM | POA: Insufficient documentation

## 2019-06-03 DIAGNOSIS — J310 Chronic rhinitis: Secondary | ICD-10-CM | POA: Diagnosis not present

## 2019-06-03 DIAGNOSIS — J4520 Mild intermittent asthma, uncomplicated: Secondary | ICD-10-CM | POA: Diagnosis not present

## 2019-06-03 MED ORDER — AZELASTINE HCL 0.1 % NA SOLN: 2.0000 | Freq: Two times a day (BID) | NASAL | 5 refills | Status: DC

## 2019-06-03 MED ORDER — PROAIR RESPICLICK 108 (90 BASE) MCG/ACT IN AEPB: 4.0000 | INHALATION_SPRAY | RESPIRATORY_TRACT | 1 refills | Status: DC | PRN

## 2019-06-03 MED ORDER — FLUTICASONE PROPIONATE 50 MCG/ACT NA SUSP: 2.0000 | Freq: Two times a day (BID) | NASAL | 5 refills | Status: AC

## 2019-06-03 NOTE — Progress Notes (Signed)
NEW PATIENT  Date of Service/Encounter:  06/03/19  Referring provider: Lucretia Kern, DO   Assessment:   Mild intermittent asthma, uncomplicated   Seasonal and perennial allergic rhinitis (grasses, ragweed, trees, dust mites and cockroach)  Plan/Recommendations:   1. Mild intermittent asthma, uncomplicated - Lung testing looked great today and there was no improvement with the albuterol treatment.  - We will try to address your allergies to see if this helps at all. - We are going to provide you with a ProAir RespiClick to have on hand to see if this helps with your symptoms (shortness of breath with physical activity) - Daily controller medication(s): NOTHING - Rescue medications: ProAir RespiClick four puffs every 4-6 hours as needed - Asthma control goals:  * Full participation in all desired activities (may need albuterol before activity) * Albuterol use two time or less a week on average (not counting use with activity) * Cough interfering with sleep two time or less a month * Oral steroids no more than once a year * No hospitalizations  2. Chronic rhinitis (grasses, ragweed, trees, dust mites and cockroach) - Testing today showed: grasses, ragweed, trees, dust mites and cockroach - Copy of test results provided.  - Avoidance measures provided. - Stop taking: all of your current medications - Start taking: Xyzal (levocetirizine) 5mg  tablet once daily and Dymista (fluticasone/azelastine) two sprays per nostril 1-2 times daily as needed - You can use an extra dose of the antihistamine, if needed, for breakthrough symptoms.  - Consider nasal saline rinses 1-2 times daily to remove allergens from the nasal cavities as well as help with mucous clearance (this is especially helpful to do before the nasal sprays are given) - Consider allergy shots as a means of long-term control. - Allergy shots "re-train" and "reset" the immune system to ignore environmental allergens and  decrease the resulting immune response to those allergens (sneezing, itchy watery eyes, runny nose, nasal congestion, etc).    - Allergy shots improve symptoms in 75-85% of patients.  - We can discuss more at the next appointment if the medications are not working for you.  3. Return in about 6 weeks (around 07/15/2019). This can be an in-person, a virtual Webex or a telephone follow up visit.   Subjective:   Gloria Reid is a 58 y.o. female presenting today for evaluation of  Chief Complaint  Patient presents with  . Allergic Rhinitis     eyes watery & itchy,  runny nose, itching, night time nasal congestion and runny nose    Gloria Reid has a history of the following: Patient Active Problem List   Diagnosis Date Noted  . Seasonal and perennial allergic rhinitis 06/03/2019  . Mild intermittent asthma, uncomplicated [id number]  . BMI 29.0-29.9,adult 06/04/2017  . MITRAL VALVE PROLAPSE 05/25/2007    History obtained from: chart review and patient.  Gloria Reid was referred by Lucretia Kern, DO.     Gloria Reid is a 58 y.o. female presenting for an evaluation of chronic rhinitis. She reports itching eyes, rhinorrhea, and discomfort. This has been an issue for over 4 years. She has been using over the counter medications for these symptoms. She has been taking more than recommended. She has done nose sprays in the past including Flonase and Nasacort and Nasonex. She has tried these with minimal improvement. She has tried Zyrtec, Human resources officer, and Claritin.   She has had these symptoms for a number of years. She did have a problem  when she was growing up, but it was always controlled with OTC medications. Symptoms persist throughout the year, but they are worse in the spring and summer. There was one point when she was going to get a root canal and implants, but she was told that she has a "hole in her sinuses" because it would cause nerve damage later and would not support implants for  the teeth.  She has sinus infections around 2-3 times per year. She does not get other infections at all. She does report some chest tightness that resolves with stopping what she is doing to catch her breath. She has never had an inhaler or a nebulizer. She gets this sensation around twice per week on average and again it resolves with cessation of her activity. She does not venture out with the oils or anything. She is not aware of any food allergies at all. She never ventures out.   Otherwise, there is no history of other atopic diseases, including food allergies, drug allergies, stinging insect allergies, eczema, urticaria or contact dermatitis. There is no significant infectious history. Vaccinations are up to date.    Past Medical History: Patient Active Problem List   Diagnosis Date Noted  . Seasonal and perennial allergic rhinitis 06/03/2019  . Mild intermittent asthma, uncomplicated Q000111Q  . BMI 29.0-29.9,adult 06/04/2017  . MITRAL VALVE PROLAPSE 05/25/2007    Medication List:  Allergies as of 06/03/2019   No Known Allergies     Medication List       Accurate as of June 03, 2019 11:56 AM. If you have any questions, ask your nurse or doctor.        azelastine 0.1 % nasal spray Commonly known as: ASTELIN Place 2 sprays into both nostrils 2 (two) times daily. Started by: Valentina Shaggy, MD   fluticasone 50 MCG/ACT nasal spray Commonly known as: FLONASE Place 2 sprays into both nostrils 2 (two) times daily. Started by: Valentina Shaggy, MD   Premarin vaginal cream Generic drug: conjugated estrogens Premarin 0.625 mg/gram vaginal cream  Insert 0.5 applicatorsful twice a week by vaginal route at bedtime.   ProAir RespiClick 123XX123 (90 Base) MCG/ACT Aepb Generic drug: Albuterol Sulfate Inhale 4 puffs into the lungs every 4 (four) hours as needed. Started by: Valentina Shaggy, MD       Birth History: non-contributory  Developmental History:  non-contributory  Past Surgical History: Past Surgical History:  Procedure Laterality Date  . MASS EXCISION  04/03/2012   Procedure: EXCISION MASS;  Surgeon: Harl Bowie, MD;  Location: [facility];  Service: General;  Laterality: Right;  Excision Right Breast Mass     Family History: History reviewed. No pertinent family history.   Social History: Kerianna lives at home with her husband. She lives in a house with carpeting throughout the home. There is gas heating and central cooling. There are no animals inside or outside of the home. There are dust mite coverings on the bedding. 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 Her husband was in the armed forces for 20+ years.    Review of Systems  Constitutional: Negative.  Negative for chills, fever, malaise/fatigue and weight loss.  HENT: Positive for congestion and sinus pain. Negative for ear discharge and ear pain.        Positive for postnasal drip and throat clearing.   Eyes: Negative for pain, discharge and redness.  Respiratory: Positive for shortness of breath. Negative for cough, sputum production and wheezing.  Cardiovascular: Negative.  Negative for chest pain and palpitations.  Gastrointestinal: Negative for abdominal pain, constipation, diarrhea, heartburn, nausea and vomiting.  Skin: Negative.  Negative for itching and rash.  Neurological: Negative for dizziness and headaches.  Endo/Heme/Allergies: Negative for environmental allergies. Does not bruise/bleed easily.  Psychiatric/Behavioral: Positive for hallucinations.       Objective:   Blood pressure 100/76, pulse 89, temperature 97.6 F (36.4 C), temperature source Temporal, resp. rate 16, height 5\' 5"  (1.651 m), weight 187 lb 12.8 oz (85.2 kg), SpO2 98 %. Body mass index is 31.25 kg/m.   Physical Exam:   Physical Exam  Constitutional: She appears well-developed.  Pleasant very talkative female.  HENT:  Head: Normocephalic and atraumatic.   Right Ear: Tympanic membrane, external ear and ear canal normal. No drainage, swelling or tenderness. Tympanic membrane is not injected, not scarred, not erythematous, not retracted and not bulging.  Left Ear: Tympanic membrane, external ear and ear canal normal. No drainage, swelling or tenderness. Tympanic membrane is not injected, not scarred, not erythematous, not retracted and not bulging.  Nose: Mucosal edema and rhinorrhea present. No nasal deformity or septal deviation. No epistaxis. Right sinus exhibits no maxillary sinus tenderness and no frontal sinus tenderness. Left sinus exhibits no maxillary sinus tenderness and no frontal sinus tenderness.  Mouth/Throat: Uvula is midline and oropharynx is clear and moist. Mucous membranes are not pale and not dry.  Marked cobblestoning in the posterior oropharynx.  Eyes: Pupils are equal, round, and reactive to light. Conjunctivae and EOM are normal. Right eye exhibits no chemosis and no discharge. Left eye exhibits no chemosis and no discharge. Right conjunctiva is not injected. Left conjunctiva is not injected.  Cardiovascular: Normal rate, regular rhythm and normal heart sounds.  Respiratory: Effort normal and breath sounds normal. No accessory muscle usage. No tachypnea. No respiratory distress. She has no wheezes. She has no rhonchi. She has no rales. She exhibits no tenderness.  Moving air well in all lung fields.  GI: There is no abdominal tenderness. There is no rebound and no guarding.  Lymphadenopathy:       Head (right side): No submandibular, no tonsillar and no occipital adenopathy present.       Head (left side): No submandibular, no tonsillar and no occipital adenopathy present.    She has no cervical adenopathy.  Neurological: She is alert.  Skin: No abrasion, no petechiae and no rash noted. Rash is not papular, not vesicular and not urticarial. No erythema. No pallor.  No urticarial or eczematous lesions noted.  Psychiatric: She has  a normal mood and affect.     Diagnostic studies:    Spirometry: results normal (FEV1: 1.93/88%, FVC: 2.40/86%, FEV1/FVC: 80%).    Spirometry consistent with normal pattern. Four puffs of albuterol given in the clinic with minimal relief symptomatically and no improvement on spirometry.   Allergy Studies:    Airborne Adult Perc - 06/03/19 0920    Time Antigen Placed  0920    Allergen Manufacturer  Lavella Hammock    Location  Back    Number of Test  59    Panel 1  Select    1. Control-Buffer 50% Glycerol  Negative    2. Control-Histamine 1 mg/ml  2+    3. Albumin saline  Negative    4. Anon Raices  Negative    5. [place]  Negative    6. Johnson  Negative    7. Kentucky Blue  3+    8. Meadow Fescue  3+    9. Perennial Rye  3+    10. Sweet Vernal  3+    11. Timothy  3+    12. Cocklebur  Negative    13. Burweed Marshelder  Negative    14. Ragweed, short  2+    15. Ragweed, Giant  Negative    16. Plantain,  English  Negative    17. Lamb's Quarters  Negative    18. Sheep Sorrell  Negative    19. Rough Pigweed  Negative    20. Marsh Elder, Rough  Negative    21. Mugwort, Common  Negative    22. Ash mix  Negative    23. Birch mix  Negative    24. Beech American  Negative    25. Box, Elder  Negative    26. Cedar, red  Negative    27. Cottonwood, [place]  Negative    28. Elm mix  Negative    29. Hickory mix  Negative    30. Maple mix  Negative    31. Oak, [place] mix  Negative    32. Pecan Pollen  Negative    33. Pine mix  Negative    34. Sycamore Eastern  Negative    35. Eagle, Black Pollen  Negative    36. Alternaria alternata  Negative    37. Cladosporium Herbarum  Negative    38. Aspergillus mix  Negative    39. Penicillium mix  Negative    40. Bipolaris sorokiniana (Helminthosporium)  Negative    41. Drechslera spicifera (Curvularia)  Negative    42. Mucor plumbeus  Negative    43. Fusarium moniliforme  Negative    44. Aureobasidium pullulans (pullulara)  Negative    45.  Rhizopus oryzae  Negative    46. Botrytis cinera  Negative    47. Epicoccum nigrum  Negative    48. Phoma betae  Negative    49. Candida Albicans  Negative    50. Trichophyton mentagrophytes  Negative    51. Mite, D Farinae  5,000 AU/ml  3+    52. Mite, D Pteronyssinus  5,000 AU/ml  3+    53. Cat Hair 10,000 BAU/ml  Negative    54.  Dog Epithelia  Negative    55. Mixed Feathers  Negative    56. Horse Epithelia  Negative    57. Cockroach, German  2+    58. Mouse  Negative    59. Tobacco Leaf  Negative     Intradermal - 06/03/19 1016    Time Antigen Placed  1016    Allergen Manufacturer  Lavella Hammock    Location  Arm    Number of Test  11    Intradermal  Select    Control  Negative    Guatem[place]  Negative    Johnson  Negative    Weed mix  Negative    Tree mix  1+    Mold 1  Negative    Mold 2  Negative    Mold 3  Negative    Mold 4  Negative    Cat  Negative    Dog  Negative       Allergy testing results were read and interpreted by myself, documented by clinical staff.         Salvatore Marvel, MD [facility] [place]

## 2019-06-03 NOTE — Patient Instructions (Addendum)
1. Mild intermittent asthma, uncomplicated - Lung testing looked great today and there was no improvement with the albuterol treatment.  - We will try to address your allergies to see if this helps at all. - We are going to provide you with a ProAir RespiClick to have on hand to see if this helps with your symptoms (shortness of breath with physical activity) - Daily controller medication(s): NOTHING - Rescue medications: ProAir RespiClick four puffs every 4-6 hours as needed - Asthma control goals:  * Full participation in all desired activities (may need albuterol before activity) * Albuterol use two time or less a week on average (not counting use with activity) * Cough interfering with sleep two time or less a month * Oral steroids no more than once a year * No hospitalizations  2. Chronic rhinitis (grasses, ragweed, trees, dust mites and cockroach) - Testing today showed: grasses, ragweed, trees, dust mites and cockroach - Copy of test results provided.  - Avoidance measures provided. - Stop taking: all of your current medications - Start taking: Xyzal (levocetirizine) 5mg  tablet once daily and Dymista (fluticasone/azelastine) two sprays per nostril 1-2 times daily as needed - You can use an extra dose of the antihistamine, if needed, for breakthrough symptoms.  - Consider nasal saline rinses 1-2 times daily to remove allergens from the nasal cavities as well as help with mucous clearance (this is especially helpful to do before the nasal sprays are given) - Consider allergy shots as a means of long-term control. - Allergy shots "re-train" and "reset" the immune system to ignore environmental allergens and decrease the resulting immune response to those allergens (sneezing, itchy watery eyes, runny nose, nasal congestion, etc).    - Allergy shots improve symptoms in 75-85% of patients.  - We can discuss more at the next appointment if the medications are not working for you.  3. Return  in about 6 weeks (around 07/15/2019). This can be an in-person, a virtual Webex or a telephone follow up visit.   Please inform us of any Emergency Department visits, hospitalizations, or changes in symptoms. Call us before going to the ED for breathing or allergy symptoms since we might be able to fit you in for a sick visit. Feel free to contact us anytime with any questions, problems, or concerns.  It was a pleasure to meet you today! You are an absolute delight!   Websites that have reliable patient information: 1. American Academy of Asthma, Allergy, and Immunology: www.aaaai.org 2. Food Allergy Research and Education (FARE): foodallergy.org 3. Mothers of Asthmatics: http://www.asthmacommunitynetwork.org 4. American College of Allergy, Asthma, and Immunology: www.acaai.org  "Like" [place] on Facebook and Instagram for our latest updates!      Make sure you are registered to vote! If you have moved or changed any of your contact information, you will need to get this updated before voting!  In some cases, you MAY be able to register to vote online: [email or web address].it    Reducing Pollen Exposure  The American Academy of Allergy, Asthma and Immunology suggests the following steps to reduce your exposure to pollen during allergy seasons.    1. Do not hang sheets or clothing out to dry; pollen may collect on these items. 2. Do not mow lawns or spend time around freshly cut grass; mowing stirs up pollen. 3. Keep windows closed at night.  Keep car windows closed while driving. 4. Minimize morning activities outdoors, a time when pollen counts are usually at their highest. 5. Stay  indoors as much as possible when pollen counts or humidity is high and on windy days when pollen tends to remain in the air longer. 6. Use air conditioning when possible.  Many air conditioners have filters that trap the pollen spores. 7. Use a HEPA room air filter to remove pollen form  the indoor air you breathe.  Control of Dust Mite Allergen    Dust mites play a major role in allergic asthma and rhinitis.  They occur in environments with high humidity wherever human skin is found.  Dust mites absorb humidity from the atmosphere (ie, they do not drink) and feed on organic matter (including shed human and animal skin).  Dust mites are a microscopic type of insect that you cannot see with the naked eye.  High levels of dust mites have been detected from mattresses, pillows, carpets, upholstered furniture, bed covers, clothes, soft toys and any woven material.  The principal allergen of the dust mite is found in its feces.  A gram of dust may contain 1,000 mites and 250,000 fecal particles.  Mite antigen is easily measured in the air during house cleaning activities.  Dust mites do not bite and do not cause harm to humans, other than by triggering allergies/asthma.    Ways to decrease your exposure to dust mites in your home:  1. Encase mattresses, box springs and pillows with a mite-impermeable barrier or cover   2. Wash sheets, blankets and drapes weekly in hot water (130 F) with detergent and dry them in a dryer on the hot setting.  3. Have the room cleaned frequently with a vacuum cleaner and a damp dust-mop.  For carpeting or rugs, vacuuming with a vacuum cleaner equipped with a high-efficiency particulate air (HEPA) filter.  The dust mite allergic individual should not be in a room which is being cleaned and should wait 1 hour after cleaning before going into the room. 4. Do not sleep on upholstered furniture (eg, couches).   5. If possible removing carpeting, upholstered furniture and drapery from the home is ideal.  Horizontal blinds should be eliminated in the rooms where the person spends the most time (bedroom, study, television room).  Washable vinyl, roller-type shades are optimal. 6. Remove all non-washable stuffed toys from the bedroom.  Wash stuffed toys weekly like  sheets and blankets above.   7. Reduce indoor humidity to less than 50%.  Inexpensive humidity monitors can be purchased at most hardware stores.  Do not use a humidifier as can make the problem worse and are not recommended.  Control of Cockroach Allergen  Cockroach allergen has been identified as an important cause of acute attacks of asthma, especially in urban settings.  There are fifty-five species of cockroach that exist in the [place], however only three, the [place], [place] species produce allergen that can affect patients with Asthma.  Allergens can be obtained from fecal particles, egg casings and secretions from cockroaches.    1. Remove food sources. 2. Reduce access to water. 3. Seal access and entry points. 4. Spray runways with 0.5-1% Diazinon or Chlorpyrifos 5. Blow boric acid power under stoves and refrigerator. 6. Place bait stations (hydramethylnon) at feeding sites.  Allergy Shots   Allergies are the result of a chain reaction that starts in the immune system. Your immune system controls how your body defends itself. For instance, if you have an allergy to pollen, your immune system identifies pollen as an invader or allergen. Your immune system overreacts  by producing antibodies called Immunoglobulin E (IgE). These antibodies travel to cells that release chemicals, causing an allergic reaction.  The concept behind allergy immunotherapy, whether it is received in the form of shots or tablets, is that the immune system can be desensitized to specific allergens that trigger allergy symptoms. Although it requires time and patience, the payback can be long-term relief.  How Do Allergy Shots Work?  Allergy shots work much like a vaccine. Your body responds to injected amounts of a particular allergen given in increasing doses, eventually developing a resistance and tolerance to it. Allergy shots can lead to decreased, minimal or no allergy symptoms.  There  generally are two phases: build-up and maintenance. Build-up often ranges from three to six months and involves receiving injections with increasing amounts of the allergens. The shots are typically given once or twice a week, though more rapid build-up schedules are sometimes used.  The maintenance phase begins when the most effective dose is reached. This dose is different for each person, depending on how allergic you are and your response to the build-up injections. Once the maintenance dose is reached, there are longer periods between injections, typically two to four weeks.  Occasionally doctors give cortisone-type shots that can temporarily reduce allergy symptoms. These types of shots are different and should not be confused with allergy immunotherapy shots.  Who Can Be Treated with Allergy Shots?  Allergy shots may be a good treatment approach for people with allergic rhinitis (hay fever), allergic asthma, conjunctivitis (eye allergy) or stinging insect allergy.   Before deciding to begin allergy shots, you should consider:  . The length of allergy season and the severity of your symptoms . Whether medications and/or changes to your environment can control your symptoms . Your desire to avoid long-term medication use . Time: allergy immunotherapy requires a major time commitment . Cost: may vary depending on your insurance coverage  Allergy shots for children age 45 and older are effective and often well tolerated. They might prevent the onset of new allergen sensitivities or the progression to asthma.  Allergy shots are not started on patients who are pregnant but can be continued on patients who become pregnant while receiving them. In some patients with other medical conditions or who take certain common medications, allergy shots may be of risk. It is important to mention other medications you talk to your allergist.   When Will I Feel Better?  Some may experience decreased  allergy symptoms during the build-up phase. For others, it may take as long as 12 months on the maintenance dose. If there is no improvement after a year of maintenance, your allergist will discuss other treatment options with you.  If you aren't responding to allergy shots, it may be because there is not enough dose of the allergen in your vaccine or there are missing allergens that were not identified during your allergy testing. Other reasons could be that there are high levels of the allergen in your environment or major exposure to non-allergic triggers like tobacco smoke.  What Is the Length of Treatment?  Once the maintenance dose is reached, allergy shots are generally continued for three to five years. The decision to stop should be discussed with your allergist at that time. Some people may experience a permanent reduction of allergy symptoms. Others may relapse and a longer course of allergy shots can be considered.  What Are the Possible Reactions?  The two types of adverse reactions that can occur with allergy  shots are local and systemic. Common local reactions include very mild redness and swelling at the injection site, which can happen immediately or several hours after. A systemic reaction, which is less common, affects the entire body or a particular body system. They are usually mild and typically respond quickly to medications. Signs include increased allergy symptoms such as sneezing, a stuffy nose or hives.  Rarely, a serious systemic reaction called anaphylaxis can develop. Symptoms include swelling in the throat, wheezing, a feeling of tightness in the chest, nausea or dizziness. Most serious systemic reactions develop within 30 minutes of allergy shots. This is why it is strongly recommended you wait in your doctor's office for 30 minutes after your injections. Your allergist is trained to watch for reactions, and his or her staff is trained and equipped with the proper  medications to identify and treat them.  Who Should Administer Allergy Shots?  The preferred location for receiving shots is your prescribing allergist's office. Injections can sometimes be given at another facility where the physician and staff are trained to recognize and treat reactions, and have received instructions by your prescribing allergist.

## 2019-06-10 ENCOUNTER — Telehealth: Payer: Self-pay

## 2019-06-10 NOTE — Telephone Encounter (Signed)
Received FMLA paperwork unsure what we are filling paperwork out for. Contacted patient and left message to get a clear understanding since she was only seen by Dr. Ernst Bowler on 06/03/2019

## 2019-06-17 NOTE — Telephone Encounter (Signed)
Dr. Ernst Bowler please advise on FMLA paperwork

## 2019-06-19 NOTE — Telephone Encounter (Signed)
Patient never called I called twice and left messages.

## 2019-06-19 NOTE — Telephone Encounter (Signed)
Did we ever get a response about why this needed to be filled out, Garlon Hatchet?   Salvatore Marvel, MD [facility]

## 2019-06-20 NOTE — Telephone Encounter (Signed)
Thank you for the update.  Rosabell Geyer, MD [facility]  

## 2019-07-24 ENCOUNTER — Ambulatory Visit: Admitting: Allergy & Immunology

## 2019-08-18 ENCOUNTER — Telehealth: Payer: Self-pay | Admitting: Family Medicine

## 2019-08-18 NOTE — Telephone Encounter (Signed)
Spoke to pt she said she will review the Providers accepting new pt and will call back

## 2019-08-18 NOTE — Telephone Encounter (Signed)
-----   Message from Lucretia Kern, DO sent at 06/17/2019 11:36 AM EST ----- Looks like patient needs in office PCP. Please see who she would like to see. I am happy to see her for any virtual visits if she sticks with a [facility] PCP. Thanks. ----- Message ----- From: Valentina Shaggy, MD Sent: 06/03/2019  12:03 PM EST To: Lucretia Kern, DO
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
## 2020-03-23 ENCOUNTER — Other Ambulatory Visit (HOSPITAL_COMMUNITY): Payer: Self-pay | Admitting: Obstetrics and Gynecology

## 2020-03-23 ENCOUNTER — Other Ambulatory Visit: Payer: Self-pay | Admitting: Obstetrics and Gynecology

## 2020-03-23 DIAGNOSIS — E041 Nontoxic single thyroid nodule: Secondary | ICD-10-CM

## 2020-05-24 ENCOUNTER — Ambulatory Visit

## 2021-05-09 ENCOUNTER — Encounter (HOSPITAL_BASED_OUTPATIENT_CLINIC_OR_DEPARTMENT_OTHER): Payer: Self-pay | Admitting: Emergency Medicine

## 2021-05-09 ENCOUNTER — Emergency Department (HOSPITAL_BASED_OUTPATIENT_CLINIC_OR_DEPARTMENT_OTHER)
Admission: EM | Admit: 2021-05-09 | Discharge: 2021-05-09 | Disposition: A | Discharge status: Home / Self Care | Attending: Emergency Medicine | Admitting: Emergency Medicine

## 2021-05-09 ENCOUNTER — Other Ambulatory Visit: Payer: Self-pay

## 2021-05-09 ENCOUNTER — Emergency Department (HOSPITAL_BASED_OUTPATIENT_CLINIC_OR_DEPARTMENT_OTHER)
Admission: EM | Admit: 2021-05-09 | Discharge: 2021-05-10 | Disposition: A | Discharge status: Home / Self Care | Source: Home / Self Care | Attending: Emergency Medicine | Admitting: Emergency Medicine

## 2021-05-09 DIAGNOSIS — J45909 Unspecified asthma, uncomplicated: Secondary | ICD-10-CM | POA: Insufficient documentation

## 2021-05-09 DIAGNOSIS — L309 Dermatitis, unspecified: Secondary | ICD-10-CM | POA: Insufficient documentation

## 2021-05-09 DIAGNOSIS — Z5321 Procedure and treatment not carried out due to patient leaving prior to being seen by health care provider: Secondary | ICD-10-CM | POA: Insufficient documentation

## 2021-05-09 DIAGNOSIS — R21 Rash and other nonspecific skin eruption: Secondary | ICD-10-CM | POA: Insufficient documentation

## 2021-05-09 MED ORDER — TRIAMCINOLONE ACETONIDE 0.1 % EX CREA: TOPICAL_CREAM | CUTANEOUS | 0 refills | Status: AC

## 2021-05-09 NOTE — ED Provider Notes (Signed)
[facility] Provider Note: Georgena Spurling, MD, FACEP  CSN: [id number] MRN: [id number] ARRIVAL: 05/09/21 at [place]: [place]  05/09/21 11:40 PM Gloria Reid is a 60 y.o. female with 3 days of a rash to her face.  Specifically it is on her lower face including her cheeks and lips.  It is both painful (burning) and pruritic.  She rates the pain as a 7 out of 10, worse with palpation.  She does not believe it is due to wearing a mask because she has been wearing masks routinely since the beginning of the pandemic.  She does not know of any other trigger that may have caused it.  The rash is mildly raised but not vesicular or bullous.  She has no other symptoms elsewhere on her body.   Past Medical History:  Diagnosis Date   Asthma    Breast mass, right    S/P Biopsy 03/2012 - Benign   Open-angle glaucoma     Past Surgical History:  Procedure Laterality Date   MASS EXCISION  04/03/2012   Procedure: EXCISION MASS;  Surgeon: Harl Bowie, MD;  Location: [facility];  Service: General;  Laterality: Right;  Excision Right Breast Mass    History reviewed. No pertinent family history.  Social History   Tobacco Use   Smoking status: Never   Smokeless tobacco: Never  Vaping Use   Vaping Use: Never used  Substance Use Topics   Alcohol use: No   Drug use: No    Prior to Admission medications   Medication Sig Start Date End Date Taking? Authorizing Provider  triamcinolone cream (KENALOG) 0.1 % Apply to affected areas of face twice daily. 05/09/21  Yes Aloysious Vangieson, MD  Albuterol Sulfate (PROAIR RESPICLICK) 300 (90 Base) MCG/ACT AEPB Inhale 4 puffs into the lungs every 4 (four) hours as needed. 06/03/19   Valentina Shaggy, MD  azelastine (ASTELIN) 0.1 % nasal spray Place 2 sprays into both nostrils 2 (two) times daily. 06/03/19   Valentina Shaggy, MD  conjugated estrogens (PREMARIN) vaginal cream  Premarin 0.625 mg/gram vaginal cream  Insert 0.5 applicatorsful twice a week by vaginal route at bedtime.    [provider]  fluticasone (FLONASE) 50 MCG/ACT nasal spray Place 2 sprays into both nostrils 2 (two) times daily. 06/03/19   Valentina Shaggy, MD    Allergies Patient has no known allergies.   REVIEW OF SYSTEMS  Negative except as noted here or in the History of Present Illness.   PHYSICAL EXAMINATION  Initial Vital Signs Blood pressure (!) 150/108, pulse 73, temperature 98.7 F (37.1 C), temperature source Oral, resp. rate 17, height 5\' 5"  (1.651 m), weight 81.2 kg, SpO2 99 %.  Examination General: Well-developed, well-nourished female in no acute distress; appearance consistent with age of record HENT: normocephalic; atraumatic Eyes: Normal appearance Neck: supple Heart: regular rate and rhythm Lungs: clear to auscultation bilaterally Abdomen: soft; nondistended; nontender; bowel sounds present Extremities: No deformity; full range of motion Neurologic: Awake, alert and oriented; motor function intact in all extremities and symmetric; no facial droop Skin: Warm and dry; maculopapular rash of the lower part of the face with confluence in areas, no vesicles or crusting Psychiatric: Normal mood and affect   RESULTS  Summary of this visit's results, reviewed and interpreted by myself:   EKG Interpretation  Date/Time:    Ventricular Rate:    PR Interval:  QRS Duration:   QT Interval:    QTC Calculation:   R Axis:     Text Interpretation:         Laboratory Studies: No results found for this or any previous visit (from the past 24 hour(s)). Imaging Studies: No results found.  ED COURSE and MDM  Nursing notes, initial and subsequent vitals signs, including pulse oximetry, reviewed and interpreted by myself.  Vitals:   05/09/21 1955 05/09/21 1956  BP: (!) 150/108   Pulse: 73   Resp: 17   Temp: 98.7 F (37.1 C)   TempSrc: Oral    SpO2: 99%   Weight:  81.2 kg  Height:  5\' 5"  (1.651 m)   Medications - No data to display  The rash is consistent with dermatitis.  If it is contact dermatitis due to the masks it may represent a new sensitization as she has been wearing masks routinely for over 2 years.  We will treat with a topical steroid refer to her PCP.  PROCEDURES  Procedures   ED DIAGNOSES     ICD-10-CM   1. Dermatitis of face  L30.9          Gregory Dowe, Jenny Reichmann, MD 05/09/21 2349

## 2021-05-09 NOTE — ED Triage Notes (Signed)
Rash on face started Friday.  Pt is having itching.  No new face wash, soap or lotions.

## 2021-05-09 NOTE — ED Triage Notes (Signed)
Pt c/o rash to face since Friday with itching

## 2021-05-10 ENCOUNTER — Other Ambulatory Visit (HOSPITAL_COMMUNITY): Payer: Self-pay

## 2021-12-19 ENCOUNTER — Other Ambulatory Visit: Payer: Self-pay

## 2021-12-19 ENCOUNTER — Ambulatory Visit (INDEPENDENT_AMBULATORY_CARE_PROVIDER_SITE_OTHER): Admitting: Student

## 2021-12-19 ENCOUNTER — Encounter: Payer: Self-pay | Admitting: Student

## 2021-12-19 VITALS — BP 119/72 | HR 96 | Temp 98.1°F | Ht 65.0 in | Wt 185.6 lb

## 2021-12-19 DIAGNOSIS — E785 Hyperlipidemia, unspecified: Secondary | ICD-10-CM | POA: Diagnosis not present

## 2021-12-19 DIAGNOSIS — R7303 Prediabetes: Secondary | ICD-10-CM

## 2021-12-19 DIAGNOSIS — H4010X1 Unspecified open-angle glaucoma, mild stage: Secondary | ICD-10-CM | POA: Diagnosis not present

## 2021-12-19 DIAGNOSIS — I059 Rheumatic mitral valve disease, unspecified: Secondary | ICD-10-CM | POA: Diagnosis not present

## 2021-12-19 DIAGNOSIS — Z0000 Encounter for general adult medical examination without abnormal findings: Secondary | ICD-10-CM

## 2021-12-19 LAB — POCT GLYCOSYLATED HEMOGLOBIN (HGB A1C): Hemoglobin A1C: 6.2 % — AB (ref 4.0–5.6)

## 2021-12-19 LAB — GLUCOSE, CAPILLARY: Glucose-Capillary: 119 mg/dL — ABNORMAL HIGH (ref 70–99)

## 2021-12-19 NOTE — Patient Instructions (Signed)
We will check your cholesterol and A1c at this time.

## 2021-12-19 NOTE — Progress Notes (Signed)
   CC: establish care after PCP moved   HPI:  [name] is a 61 y.o. F with PMH per below who presents to establish care. Please see problem based charting under encounters tab for further details.   Medications:  OTC vitamin C supplement ASA '81mg'$  qd Multivitamin   Past Medical History:  Diagnosis Date   Breast mass, right    S/P Biopsy 03/2012 - Benign   Hyperlipidemia    Obesity (BMI 30-39.9)    Open-angle glaucoma    Diagnosed in 2014   Prediabetes    Past Surgical History:  Procedure Laterality Date   MASS EXCISION  04/03/2012   Procedure: EXCISION MASS;  Surgeon: Harl Bowie, MD;  Location: [facility];  Service: General;  Laterality: Right;  Excision Right Breast Mass   FH: No cancers  SH: Never smoker, never alcohol  [facility] health at [facility]   OB/GYN: LMP, unclear, no current AUB. No children. Follows regularly with gynecologist and is uptodate with cervical cancer screening and breast cancer screening. She will have the most recent results faxed to our clinic.   Review of Systems:  Please see problem based charting under encounters tab for further details.    Physical Exam:  Vitals:   12/19/21 0917  BP: 119/72  Pulse: 96  Temp: 98.1 F (36.7 C)  TempSrc: Oral  SpO2: 98%  Weight: 185 lb 9.6 oz (84.2 kg)  Height: '5\' 5"'$  (1.651 m)    Constitutional: Well-developed, well-nourished, and in no distress.  HENT:  Head: Normocephalic and atraumatic.  Eyes: EOM are normal.  Neck: Normal range of motion.  Cardiovascular: Normal rate, regular rhythm, intact distal pulses. No gallop and no friction rub.  No murmur heard. No lower extremity edema  Pulmonary: Non labored breathing on room air, no wheezing or rales  Abdominal: Soft. Normal bowel sounds. Non distended and non tender Musculoskeletal: Normal range of motion.        General: No tenderness or edema.  Neurological: Alert and oriented to person, place, and time. Non focal  Skin: Skin is  warm and dry.    Assessment & Plan:   See Encounters Tab for problem based charting.  Patient discussed with Dr. Dareen Piano

## 2021-12-20 ENCOUNTER — Encounter: Payer: Self-pay | Admitting: Student

## 2021-12-20 DIAGNOSIS — E785 Hyperlipidemia, unspecified: Secondary | ICD-10-CM | POA: Insufficient documentation

## 2021-12-20 DIAGNOSIS — H4010X0 Unspecified open-angle glaucoma, stage unspecified: Secondary | ICD-10-CM | POA: Insufficient documentation

## 2021-12-20 DIAGNOSIS — Z0000 Encounter for general adult medical examination without abnormal findings: Secondary | ICD-10-CM | POA: Insufficient documentation

## 2021-12-20 DIAGNOSIS — R7303 Prediabetes: Secondary | ICD-10-CM | POA: Insufficient documentation

## 2021-12-20 LAB — LIPID PANEL
Chol/HDL Ratio: 2.9 ratio (ref 0.0–4.4)
Cholesterol, Total: 211 mg/dL — ABNORMAL HIGH (ref 100–199)
HDL: 73 mg/dL (ref >39)
LDL Chol Calc (NIH): 125 mg/dL — ABNORMAL HIGH (ref 0–99)
Triglycerides: 72 mg/dL (ref 0–149)
VLDL Cholesterol Cal: 13 mg/dL (ref 5–40)

## 2021-12-20 LAB — BMP8+ANION GAP
Anion Gap: 13 mmol/L (ref 10.0–18.0)
BUN/Creatinine Ratio: 17 (ref 12–28)
BUN: 15 mg/dL (ref 8–27)
CO2: 22 mmol/L (ref 20–29)
Calcium: 9.8 mg/dL (ref 8.7–10.3)
Chloride: 106 mmol/L (ref 96–106)
Creatinine, Ser: 0.89 mg/dL (ref 0.57–1.00)
Glucose: 119 mg/dL — ABNORMAL HIGH (ref 70–99)
Potassium: 4.4 mmol/L (ref 3.5–5.2)
Sodium: 141 mmol/L (ref 134–144)
eGFR: 73 mL/min/{1.73_m2} (ref >59)

## 2021-12-20 NOTE — Assessment & Plan Note (Signed)
Unclear if this a true diagnosis. Patient states that this was diagnosed in 2009 by her PCP but she never underwent TTE. She has no murmur on exam, will hold on further evaluation.

## 2021-12-20 NOTE — Assessment & Plan Note (Signed)
Component Ref Range & Units 1 d ago 3 yr ago 4 yr ago 11 yr ago 13 yr ago  Hemoglobin A1C 4.0 - 5.6 % 6.2Abnormal  6.3 R, CM  6.3 R, CM  6.2High R, CM  6.2 R, CM    Continue to encourage adjustments to her diet and exercise. Could also consider ozempic or other GLP1 agonist at next clinic visit which could also help with wweight loss.

## 2021-12-20 NOTE — Assessment & Plan Note (Addendum)
Lipid Panel     Component Value Date/Time   CHOL 211 (H) 12/19/2021 0950   TRIG 72 12/19/2021 0950   HDL 73 12/19/2021 0950   CHOLHDL 2.9 12/19/2021 0950   CHOLHDL 3 03/18/2018 1111   VLDL 10.4 03/18/2018 1111   LDLCALC 125 (H) 12/19/2021 0950   LDLDIRECT 119.9 09/03/2013 0928   LABVLDL 13 12/19/2021 0950   ASCVD risk score 4.7%, if list her prediabetes as diabetes in risk calculator her risk score is 11%. Will discuss with patient at next clinic visit her thoughts regarding initiation of statin therapy.

## 2021-12-20 NOTE — Assessment & Plan Note (Signed)
Patient states she is uptodate with breast and cervical cancer screenings she will have these faxed to our office.   Last colonoscopy was in 2014, she is due for another 2024.

## 2021-12-20 NOTE — Assessment & Plan Note (Addendum)
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 The last recorded follow up was in 2017 with optemetry and her exam was noted to be stable. Patient does mention she follows with optometry annually and last saw them in 2022. She will have these results faxed to our clinic.

## 2021-12-21 NOTE — Progress Notes (Signed)
Internal Medicine Clinic Attending  Case discussed with Dr. Carter  At the time of the visit.  We reviewed the resident's history and exam and pertinent patient test results.  I agree with the assessment, diagnosis, and plan of care documented in the resident's note.  

## 2022-12-11 ENCOUNTER — Encounter: Payer: Self-pay | Admitting: *Deleted

## 2024-09-05 ENCOUNTER — Ambulatory Visit: Payer: Self-pay | Admitting: Family Medicine
